# Patient Record
Sex: Female | Born: 1935 | Race: White | Hispanic: No | Smoking: Never smoker
Health system: Southern US, Community
[De-identification: ages and names within clinical notes are randomized; demographics above are authoritative.]

## PROBLEM LIST (undated history)

## (undated) DIAGNOSIS — M109 Gout, unspecified: Secondary | ICD-10-CM

## (undated) DIAGNOSIS — E559 Vitamin D deficiency, unspecified: Secondary | ICD-10-CM

## (undated) DIAGNOSIS — F32A Depression, unspecified: Secondary | ICD-10-CM

## (undated) DIAGNOSIS — E079 Disorder of thyroid, unspecified: Secondary | ICD-10-CM

## (undated) DIAGNOSIS — I700 Atherosclerosis of aorta: Secondary | ICD-10-CM

## (undated) DIAGNOSIS — E538 Deficiency of other specified B group vitamins: Secondary | ICD-10-CM

## (undated) DIAGNOSIS — K219 Gastro-esophageal reflux disease without esophagitis: Secondary | ICD-10-CM

## (undated) DIAGNOSIS — D509 Iron deficiency anemia, unspecified: Secondary | ICD-10-CM

## (undated) DIAGNOSIS — F0280 Dementia in other diseases classified elsewhere without behavioral disturbance: Secondary | ICD-10-CM

## (undated) DIAGNOSIS — N390 Urinary tract infection, site not specified: Secondary | ICD-10-CM

## (undated) DIAGNOSIS — N939 Abnormal uterine and vaginal bleeding, unspecified: Secondary | ICD-10-CM

## (undated) DIAGNOSIS — N200 Calculus of kidney: Secondary | ICD-10-CM

## (undated) DIAGNOSIS — I509 Heart failure, unspecified: Secondary | ICD-10-CM

## (undated) DIAGNOSIS — F329 Major depressive disorder, single episode, unspecified: Secondary | ICD-10-CM

## (undated) DIAGNOSIS — H6120 Impacted cerumen, unspecified ear: Secondary | ICD-10-CM

## (undated) DIAGNOSIS — G309 Alzheimer's disease, unspecified: Secondary | ICD-10-CM

## (undated) DIAGNOSIS — R1310 Dysphagia, unspecified: Secondary | ICD-10-CM

---

## 2016-02-06 ENCOUNTER — Encounter (HOSPITAL_COMMUNITY): Payer: Self-pay | Admitting: Emergency Medicine

## 2016-02-06 ENCOUNTER — Emergency Department (HOSPITAL_COMMUNITY): Payer: Medicare (Managed Care) | Admitting: Anesthesiology

## 2016-02-06 ENCOUNTER — Inpatient Hospital Stay (HOSPITAL_COMMUNITY)
Admission: EM | Admit: 2016-02-06 | Discharge: 2016-03-14 | DRG: 252 | Disposition: E | Payer: Medicare (Managed Care) | Attending: Internal Medicine | Admitting: Internal Medicine

## 2016-02-06 ENCOUNTER — Encounter (HOSPITAL_COMMUNITY): Admission: EM | Disposition: E | Payer: Self-pay | Source: Home / Self Care | Attending: Vascular Surgery

## 2016-02-06 ENCOUNTER — Emergency Department (HOSPITAL_COMMUNITY): Payer: Medicare (Managed Care)

## 2016-02-06 DIAGNOSIS — K219 Gastro-esophageal reflux disease without esophagitis: Secondary | ICD-10-CM | POA: Diagnosis present

## 2016-02-06 DIAGNOSIS — N183 Chronic kidney disease, stage 3 (moderate): Secondary | ICD-10-CM | POA: Diagnosis present

## 2016-02-06 DIAGNOSIS — Z6841 Body Mass Index (BMI) 40.0 and over, adult: Secondary | ICD-10-CM | POA: Diagnosis not present

## 2016-02-06 DIAGNOSIS — I272 Other secondary pulmonary hypertension: Secondary | ICD-10-CM | POA: Diagnosis present

## 2016-02-06 DIAGNOSIS — Z87442 Personal history of urinary calculi: Secondary | ICD-10-CM | POA: Diagnosis not present

## 2016-02-06 DIAGNOSIS — R7989 Other specified abnormal findings of blood chemistry: Secondary | ICD-10-CM | POA: Diagnosis not present

## 2016-02-06 DIAGNOSIS — F0280 Dementia in other diseases classified elsewhere without behavioral disturbance: Secondary | ICD-10-CM | POA: Diagnosis present

## 2016-02-06 DIAGNOSIS — Z66 Do not resuscitate: Secondary | ICD-10-CM | POA: Diagnosis not present

## 2016-02-06 DIAGNOSIS — M6282 Rhabdomyolysis: Secondary | ICD-10-CM | POA: Diagnosis not present

## 2016-02-06 DIAGNOSIS — I5041 Acute combined systolic (congestive) and diastolic (congestive) heart failure: Secondary | ICD-10-CM | POA: Diagnosis present

## 2016-02-06 DIAGNOSIS — R1310 Dysphagia, unspecified: Secondary | ICD-10-CM | POA: Diagnosis present

## 2016-02-06 DIAGNOSIS — I5021 Acute systolic (congestive) heart failure: Secondary | ICD-10-CM | POA: Diagnosis not present

## 2016-02-06 DIAGNOSIS — Z515 Encounter for palliative care: Secondary | ICD-10-CM | POA: Diagnosis not present

## 2016-02-06 DIAGNOSIS — N179 Acute kidney failure, unspecified: Secondary | ICD-10-CM

## 2016-02-06 DIAGNOSIS — I998 Other disorder of circulatory system: Secondary | ICD-10-CM | POA: Diagnosis not present

## 2016-02-06 DIAGNOSIS — I462 Cardiac arrest due to underlying cardiac condition: Secondary | ICD-10-CM | POA: Diagnosis not present

## 2016-02-06 DIAGNOSIS — Z885 Allergy status to narcotic agent status: Secondary | ICD-10-CM

## 2016-02-06 DIAGNOSIS — M79A21 Nontraumatic compartment syndrome of right lower extremity: Secondary | ICD-10-CM | POA: Diagnosis present

## 2016-02-06 DIAGNOSIS — I130 Hypertensive heart and chronic kidney disease with heart failure and stage 1 through stage 4 chronic kidney disease, or unspecified chronic kidney disease: Secondary | ICD-10-CM | POA: Diagnosis present

## 2016-02-06 DIAGNOSIS — I4891 Unspecified atrial fibrillation: Secondary | ICD-10-CM | POA: Diagnosis not present

## 2016-02-06 DIAGNOSIS — D62 Acute posthemorrhagic anemia: Secondary | ICD-10-CM | POA: Diagnosis not present

## 2016-02-06 DIAGNOSIS — N200 Calculus of kidney: Secondary | ICD-10-CM | POA: Diagnosis present

## 2016-02-06 DIAGNOSIS — D509 Iron deficiency anemia, unspecified: Secondary | ICD-10-CM | POA: Diagnosis present

## 2016-02-06 DIAGNOSIS — R34 Anuria and oliguria: Secondary | ICD-10-CM | POA: Diagnosis not present

## 2016-02-06 DIAGNOSIS — Z789 Other specified health status: Secondary | ICD-10-CM

## 2016-02-06 DIAGNOSIS — I959 Hypotension, unspecified: Secondary | ICD-10-CM | POA: Diagnosis not present

## 2016-02-06 DIAGNOSIS — E874 Mixed disorder of acid-base balance: Secondary | ICD-10-CM | POA: Diagnosis not present

## 2016-02-06 DIAGNOSIS — I471 Supraventricular tachycardia: Secondary | ICD-10-CM | POA: Diagnosis not present

## 2016-02-06 DIAGNOSIS — M109 Gout, unspecified: Secondary | ICD-10-CM | POA: Diagnosis present

## 2016-02-06 DIAGNOSIS — I739 Peripheral vascular disease, unspecified: Secondary | ICD-10-CM | POA: Diagnosis present

## 2016-02-06 DIAGNOSIS — R627 Adult failure to thrive: Secondary | ICD-10-CM | POA: Diagnosis present

## 2016-02-06 DIAGNOSIS — I4901 Ventricular fibrillation: Secondary | ICD-10-CM | POA: Diagnosis not present

## 2016-02-06 DIAGNOSIS — N139 Obstructive and reflux uropathy, unspecified: Secondary | ICD-10-CM | POA: Diagnosis present

## 2016-02-06 DIAGNOSIS — I469 Cardiac arrest, cause unspecified: Secondary | ICD-10-CM

## 2016-02-06 DIAGNOSIS — I509 Heart failure, unspecified: Secondary | ICD-10-CM

## 2016-02-06 DIAGNOSIS — R778 Other specified abnormalities of plasma proteins: Secondary | ICD-10-CM | POA: Diagnosis not present

## 2016-02-06 DIAGNOSIS — N170 Acute kidney failure with tubular necrosis: Secondary | ICD-10-CM | POA: Diagnosis present

## 2016-02-06 DIAGNOSIS — I743 Embolism and thrombosis of arteries of the lower extremities: Secondary | ICD-10-CM | POA: Diagnosis present

## 2016-02-06 DIAGNOSIS — E876 Hypokalemia: Secondary | ICD-10-CM | POA: Diagnosis not present

## 2016-02-06 DIAGNOSIS — I4581 Long QT syndrome: Secondary | ICD-10-CM | POA: Diagnosis not present

## 2016-02-06 DIAGNOSIS — J90 Pleural effusion, not elsewhere classified: Secondary | ICD-10-CM

## 2016-02-06 DIAGNOSIS — B3749 Other urogenital candidiasis: Secondary | ICD-10-CM | POA: Diagnosis present

## 2016-02-06 DIAGNOSIS — I700 Atherosclerosis of aorta: Secondary | ICD-10-CM | POA: Diagnosis present

## 2016-02-06 DIAGNOSIS — I480 Paroxysmal atrial fibrillation: Secondary | ICD-10-CM | POA: Diagnosis present

## 2016-02-06 DIAGNOSIS — A419 Sepsis, unspecified organism: Secondary | ICD-10-CM

## 2016-02-06 DIAGNOSIS — J9601 Acute respiratory failure with hypoxia: Secondary | ICD-10-CM | POA: Diagnosis not present

## 2016-02-06 DIAGNOSIS — I701 Atherosclerosis of renal artery: Secondary | ICD-10-CM | POA: Diagnosis present

## 2016-02-06 DIAGNOSIS — F329 Major depressive disorder, single episode, unspecified: Secondary | ICD-10-CM | POA: Diagnosis present

## 2016-02-06 DIAGNOSIS — I472 Ventricular tachycardia: Secondary | ICD-10-CM | POA: Diagnosis not present

## 2016-02-06 DIAGNOSIS — T462X5A Adverse effect of other antidysrhythmic drugs, initial encounter: Secondary | ICD-10-CM | POA: Diagnosis not present

## 2016-02-06 DIAGNOSIS — I70229 Atherosclerosis of native arteries of extremities with rest pain, unspecified extremity: Secondary | ICD-10-CM

## 2016-02-06 DIAGNOSIS — E039 Hypothyroidism, unspecified: Secondary | ICD-10-CM | POA: Diagnosis present

## 2016-02-06 DIAGNOSIS — I7090 Unspecified atherosclerosis: Secondary | ICD-10-CM

## 2016-02-06 DIAGNOSIS — G309 Alzheimer's disease, unspecified: Secondary | ICD-10-CM | POA: Diagnosis present

## 2016-02-06 DIAGNOSIS — Z87440 Personal history of urinary (tract) infections: Secondary | ICD-10-CM | POA: Diagnosis not present

## 2016-02-06 DIAGNOSIS — M79604 Pain in right leg: Secondary | ICD-10-CM | POA: Diagnosis present

## 2016-02-06 DIAGNOSIS — E669 Obesity, unspecified: Secondary | ICD-10-CM | POA: Diagnosis present

## 2016-02-06 DIAGNOSIS — J9811 Atelectasis: Secondary | ICD-10-CM | POA: Diagnosis present

## 2016-02-06 DIAGNOSIS — E538 Deficiency of other specified B group vitamins: Secondary | ICD-10-CM | POA: Diagnosis present

## 2016-02-06 HISTORY — DX: Iron deficiency anemia, unspecified: D50.9

## 2016-02-06 HISTORY — DX: Disorder of thyroid, unspecified: E07.9

## 2016-02-06 HISTORY — PX: EMBOLECTOMY: SHX44

## 2016-02-06 HISTORY — DX: Calculus of kidney: N20.0

## 2016-02-06 HISTORY — DX: Gastro-esophageal reflux disease without esophagitis: K21.9

## 2016-02-06 HISTORY — DX: Gout, unspecified: M10.9

## 2016-02-06 HISTORY — PX: APPLICATION OF WOUND VAC: SHX5189

## 2016-02-06 HISTORY — DX: Impacted cerumen, unspecified ear: H61.20

## 2016-02-06 HISTORY — DX: Vitamin D deficiency, unspecified: E55.9

## 2016-02-06 HISTORY — DX: Alzheimer's disease, unspecified: G30.9

## 2016-02-06 HISTORY — PX: FASCIOTOMY: SHX132

## 2016-02-06 HISTORY — DX: Dysphagia, unspecified: R13.10

## 2016-02-06 HISTORY — PX: PATCH ANGIOPLASTY: SHX6230

## 2016-02-06 HISTORY — DX: Depression, unspecified: F32.A

## 2016-02-06 HISTORY — DX: Deficiency of other specified B group vitamins: E53.8

## 2016-02-06 HISTORY — DX: Heart failure, unspecified: I50.9

## 2016-02-06 HISTORY — DX: Urinary tract infection, site not specified: N39.0

## 2016-02-06 HISTORY — DX: Major depressive disorder, single episode, unspecified: F32.9

## 2016-02-06 HISTORY — DX: Atherosclerosis of aorta: I70.0

## 2016-02-06 HISTORY — DX: Abnormal uterine and vaginal bleeding, unspecified: N93.9

## 2016-02-06 HISTORY — DX: Dementia in other diseases classified elsewhere, unspecified severity, without behavioral disturbance, psychotic disturbance, mood disturbance, and anxiety: F02.80

## 2016-02-06 LAB — URINALYSIS, ROUTINE W REFLEX MICROSCOPIC
GLUCOSE, UA: 100 mg/dL — AB
Ketones, ur: 40 mg/dL — AB
Nitrite: POSITIVE — AB
Protein, ur: 100 mg/dL — AB
SPECIFIC GRAVITY, URINE: 1.02 (ref 1.005–1.030)
pH: 5 (ref 5.0–8.0)

## 2016-02-06 LAB — URINE MICROSCOPIC-ADD ON

## 2016-02-06 LAB — TROPONIN I: Troponin I: 0.42 ng/mL — ABNORMAL HIGH (ref <0.031)

## 2016-02-06 LAB — CBC
HCT: 37.7 % (ref 36.0–46.0)
Hemoglobin: 12.4 g/dL (ref 12.0–15.0)
MCH: 30.5 pg (ref 26.0–34.0)
MCHC: 32.9 g/dL (ref 30.0–36.0)
MCV: 92.9 fL (ref 78.0–100.0)
PLATELETS: 188 10*3/uL (ref 150–400)
RBC: 4.06 MIL/uL (ref 3.87–5.11)
RDW: 13.6 % (ref 11.5–15.5)
WBC: 8 10*3/uL (ref 4.0–10.5)

## 2016-02-06 LAB — BASIC METABOLIC PANEL
ANION GAP: 18 — AB (ref 5–15)
BUN: 11 mg/dL (ref 6–20)
CALCIUM: 8.7 mg/dL — AB (ref 8.9–10.3)
CO2: 16 mmol/L — AB (ref 22–32)
Chloride: 105 mmol/L (ref 101–111)
Creatinine, Ser: 2.28 mg/dL — ABNORMAL HIGH (ref 0.44–1.00)
GFR calc Af Amer: 22 mL/min — ABNORMAL LOW (ref >60)
GFR calc non Af Amer: 19 mL/min — ABNORMAL LOW (ref >60)
GLUCOSE: 183 mg/dL — AB (ref 65–99)
Potassium: 3.6 mmol/L (ref 3.5–5.1)
Sodium: 139 mmol/L (ref 135–145)

## 2016-02-06 LAB — PROTIME-INR
INR: 1.19 (ref 0.00–1.49)
PROTHROMBIN TIME: 15.3 s — AB (ref 11.6–15.2)

## 2016-02-06 LAB — GLUCOSE, CAPILLARY: Glucose-Capillary: 194 mg/dL — ABNORMAL HIGH (ref 65–99)

## 2016-02-06 LAB — MAGNESIUM: MAGNESIUM: 1.7 mg/dL (ref 1.7–2.4)

## 2016-02-06 LAB — MRSA PCR SCREENING: MRSA by PCR: POSITIVE — AB

## 2016-02-06 LAB — BRAIN NATRIURETIC PEPTIDE: B Natriuretic Peptide: 424.3 pg/mL — ABNORMAL HIGH (ref 0.0–100.0)

## 2016-02-06 LAB — TSH: TSH: 0.377 u[IU]/mL (ref 0.350–4.500)

## 2016-02-06 SURGERY — EMBOLECTOMY
Anesthesia: General | Site: Leg Upper | Laterality: Right

## 2016-02-06 MED ORDER — ALBUMIN HUMAN 5 % IV SOLN
INTRAVENOUS | Status: AC
  Administered 2016-02-06: 12.5 g via INTRAVENOUS
  Filled 2016-02-06: qty 250

## 2016-02-06 MED ORDER — LIDOCAINE HCL (CARDIAC) 20 MG/ML IV SOLN
INTRAVENOUS | Status: DC | PRN
  Administered 2016-02-06: 50 mg via INTRAVENOUS

## 2016-02-06 MED ORDER — LABETALOL HCL 5 MG/ML IV SOLN: 10.0000 mg | INTRAVENOUS | Status: DC | PRN

## 2016-02-06 MED ORDER — HEPARIN (PORCINE) IN NACL 100-0.45 UNIT/ML-% IJ SOLN
950.0000 [IU]/h | INTRAMUSCULAR | Status: DC
Start: 2016-02-06 — End: 2016-02-07
  Administered 2016-02-06: 1200 [IU]/h via INTRAVENOUS
  Administered 2016-02-07: 950 [IU]/h via INTRAVENOUS
  Filled 2016-02-06 (×3): qty 250

## 2016-02-06 MED ORDER — SULFAMETHOXAZOLE-TRIMETHOPRIM 800-160 MG PO TABS
2.0000 | ORAL_TABLET | Freq: Two times a day (BID) | ORAL | Status: DC
  Administered 2016-02-06: 2 via ORAL
  Filled 2016-02-06 (×2): qty 2

## 2016-02-06 MED ORDER — LEVOTHYROXINE SODIUM 75 MCG PO TABS
75.0000 ug | ORAL_TABLET | Freq: Every day | ORAL | Status: DC
  Administered 2016-02-07 – 2016-02-14 (×6): 75 ug via ORAL
  Filled 2016-02-06 (×9): qty 1

## 2016-02-06 MED ORDER — 0.9 % SODIUM CHLORIDE (POUR BTL) OPTIME
TOPICAL | Status: DC | PRN
  Administered 2016-02-06: 2000 mL

## 2016-02-06 MED ORDER — DILTIAZEM LOAD VIA INFUSION: 10.0000 mg | Freq: Once | INTRAVENOUS | Status: DC

## 2016-02-06 MED ORDER — SODIUM CHLORIDE 0.9 % IV SOLN
INTRAVENOUS | Status: DC | PRN
  Administered 2016-02-06: 500 mL

## 2016-02-06 MED ORDER — HEPARIN SODIUM (PORCINE) 1000 UNIT/ML IJ SOLN
INTRAMUSCULAR | Status: DC | PRN
  Administered 2016-02-06: 7 mL via INTRAVENOUS

## 2016-02-06 MED ORDER — ACETAMINOPHEN 325 MG PO TABS: 325.0000 mg | ORAL_TABLET | ORAL | Status: DC | PRN

## 2016-02-06 MED ORDER — MORPHINE SULFATE (PF) 2 MG/ML IV SOLN
1.0000 mg | INTRAVENOUS | Status: DC | PRN
  Administered 2016-02-07 – 2016-02-13 (×2): 2 mg via INTRAVENOUS
  Administered 2016-02-14 (×2): 1 mg via INTRAVENOUS
  Administered 2016-02-15 (×2): 2 mg via INTRAVENOUS
  Filled 2016-02-06 (×7): qty 1

## 2016-02-06 MED ORDER — METOPROLOL TARTRATE 5 MG/5ML IV SOLN: 2.0000 mg | INTRAVENOUS | Status: DC | PRN

## 2016-02-06 MED ORDER — OXYCODONE-ACETAMINOPHEN 5-325 MG PO TABS
1.0000 | ORAL_TABLET | ORAL | Status: DC | PRN
  Administered 2016-02-06: 1 via ORAL
  Administered 2016-02-07: 2 via ORAL
  Administered 2016-02-12: 1 via ORAL
  Filled 2016-02-06: qty 1
  Filled 2016-02-06: qty 2
  Filled 2016-02-06: qty 1

## 2016-02-06 MED ORDER — SODIUM CHLORIDE 0.9 % IV SOLN
INTRAVENOUS | Status: DC
  Administered 2016-02-06 (×2): via INTRAVENOUS

## 2016-02-06 MED ORDER — FENTANYL CITRATE (PF) 250 MCG/5ML IJ SOLN
INTRAMUSCULAR | Status: AC
  Filled 2016-02-06: qty 5

## 2016-02-06 MED ORDER — DONEPEZIL HCL 10 MG PO TABS
10.0000 mg | ORAL_TABLET | Freq: Every day | ORAL | Status: DC
  Administered 2016-02-06 – 2016-02-14 (×7): 10 mg via ORAL
  Filled 2016-02-06 (×7): qty 1

## 2016-02-06 MED ORDER — HALOPERIDOL 0.5 MG PO TABS
0.5000 mg | ORAL_TABLET | Freq: Two times a day (BID) | ORAL | Status: DC | PRN
  Filled 2016-02-06: qty 1

## 2016-02-06 MED ORDER — THROMBIN 20000 UNITS EX SOLR
CUTANEOUS | Status: AC
  Filled 2016-02-06: qty 20000

## 2016-02-06 MED ORDER — POTASSIUM CHLORIDE CRYS ER 20 MEQ PO TBCR: 20.0000 meq | EXTENDED_RELEASE_TABLET | Freq: Every day | ORAL | Status: DC | PRN

## 2016-02-06 MED ORDER — ALUM & MAG HYDROXIDE-SIMETH 200-200-20 MG/5ML PO SUSP: 15.0000 mL | ORAL | Status: DC | PRN

## 2016-02-06 MED ORDER — DILTIAZEM HCL 100 MG IV SOLR
5.0000 mg/h | INTRAVENOUS | Status: DC
  Administered 2016-02-06: 5 mg/h via INTRAVENOUS
  Filled 2016-02-06: qty 100

## 2016-02-06 MED ORDER — MELATONIN 5 MG PO TABS: 5.0000 mg | ORAL_TABLET | Freq: Every day | ORAL | Status: DC

## 2016-02-06 MED ORDER — SODIUM CHLORIDE 0.9 % IV SOLN
INTRAVENOUS | Status: DC
  Administered 2016-02-06: 22:00:00 via INTRAVENOUS

## 2016-02-06 MED ORDER — DEXTROSE 5 % IV SOLN
100.0000 mg | INTRAVENOUS | Status: DC | PRN
  Administered 2016-02-06: 10 mg/h via INTRAVENOUS

## 2016-02-06 MED ORDER — PHENOL 1.4 % MT LIQD: 1.0000 | OROMUCOSAL | Status: DC | PRN

## 2016-02-06 MED ORDER — ONDANSETRON HCL 4 MG/2ML IJ SOLN
4.0000 mg | Freq: Four times a day (QID) | INTRAMUSCULAR | Status: DC | PRN
  Administered 2016-02-08 – 2016-02-09 (×2): 4 mg via INTRAVENOUS
  Filled 2016-02-06 (×2): qty 2

## 2016-02-06 MED ORDER — PANTOPRAZOLE SODIUM 40 MG PO TBEC
40.0000 mg | DELAYED_RELEASE_TABLET | Freq: Every day | ORAL | Status: DC
  Administered 2016-02-06 – 2016-02-15 (×7): 40 mg via ORAL
  Filled 2016-02-06 (×9): qty 1

## 2016-02-06 MED ORDER — SODIUM CHLORIDE 0.9 % IV SOLN
500.0000 mL | Freq: Once | INTRAVENOUS | Status: AC | PRN
  Administered 2016-02-06: 500 mL via INTRAVENOUS

## 2016-02-06 MED ORDER — SUGAMMADEX SODIUM 200 MG/2ML IV SOLN
INTRAVENOUS | Status: DC | PRN
  Administered 2016-02-06: 166 mg via INTRAVENOUS

## 2016-02-06 MED ORDER — CYANOCOBALAMIN 1000 MCG/ML IJ SOLN
2000.0000 ug | INTRAMUSCULAR | Status: DC
  Administered 2016-02-12: 2000 ug via INTRAMUSCULAR
  Filled 2016-02-06: qty 2

## 2016-02-06 MED ORDER — ALBUMIN HUMAN 5 % IV SOLN
INTRAVENOUS | Status: DC | PRN
  Administered 2016-02-06: 17:00:00 via INTRAVENOUS

## 2016-02-06 MED ORDER — GUAIFENESIN-DM 100-10 MG/5ML PO SYRP: 15.0000 mL | ORAL_SOLUTION | ORAL | Status: DC | PRN

## 2016-02-06 MED ORDER — CEFUROXIME SODIUM 1.5 G IJ SOLR
1.5000 g | Freq: Two times a day (BID) | INTRAMUSCULAR | Status: AC
  Administered 2016-02-06 – 2016-02-07 (×2): 1.5 g via INTRAVENOUS
  Filled 2016-02-06 (×2): qty 1.5

## 2016-02-06 MED ORDER — PHENYLEPHRINE HCL 10 MG/ML IJ SOLN
10.0000 mg | INTRAVENOUS | Status: DC | PRN
  Administered 2016-02-06: 50 ug/min via INTRAVENOUS

## 2016-02-06 MED ORDER — MUPIROCIN 2 % EX OINT
1.0000 "application " | TOPICAL_OINTMENT | Freq: Two times a day (BID) | CUTANEOUS | Status: AC
  Administered 2016-02-06 – 2016-02-11 (×10): 1 via NASAL
  Filled 2016-02-06 (×2): qty 22

## 2016-02-06 MED ORDER — FENTANYL CITRATE (PF) 100 MCG/2ML IJ SOLN
INTRAMUSCULAR | Status: DC | PRN
  Administered 2016-02-06 (×2): 50 ug via INTRAVENOUS

## 2016-02-06 MED ORDER — MAGNESIUM SULFATE 2 GM/50ML IV SOLN
2.0000 g | Freq: Every day | INTRAVENOUS | Status: AC | PRN
  Administered 2016-02-07: 2 g via INTRAVENOUS
  Filled 2016-02-06: qty 50

## 2016-02-06 MED ORDER — HYDRALAZINE HCL 20 MG/ML IJ SOLN: 5.0000 mg | INTRAMUSCULAR | Status: DC | PRN

## 2016-02-06 MED ORDER — ALBUMIN HUMAN 5 % IV SOLN
12.5000 g | Freq: Once | INTRAVENOUS | Status: AC
  Administered 2016-02-06: 12.5 g via INTRAVENOUS

## 2016-02-06 MED ORDER — FERROUS SULFATE 325 (65 FE) MG PO TABS
325.0000 mg | ORAL_TABLET | Freq: Every day | ORAL | Status: DC
  Administered 2016-02-07 – 2016-02-12 (×5): 325 mg via ORAL
  Filled 2016-02-06 (×10): qty 1

## 2016-02-06 MED ORDER — COLCHICINE 0.6 MG PO TABS
0.6000 mg | ORAL_TABLET | Freq: Every day | ORAL | Status: DC
  Administered 2016-02-07: 0.6 mg via ORAL
  Filled 2016-02-06: qty 1

## 2016-02-06 MED ORDER — FUROSEMIDE 10 MG/ML IJ SOLN
20.0000 mg | Freq: Once | INTRAMUSCULAR | Status: AC
  Administered 2016-02-06: 20 mg via INTRAVENOUS
  Filled 2016-02-06: qty 2

## 2016-02-06 MED ORDER — HEPARIN BOLUS VIA INFUSION
4000.0000 [IU] | Freq: Once | INTRAVENOUS | Status: AC
  Administered 2016-02-06: 4000 [IU] via INTRAVENOUS
  Filled 2016-02-06: qty 4000

## 2016-02-06 MED ORDER — DOCUSATE SODIUM 100 MG PO CAPS
100.0000 mg | ORAL_CAPSULE | Freq: Every day | ORAL | Status: DC
  Administered 2016-02-07 – 2016-02-11 (×3): 100 mg via ORAL
  Filled 2016-02-06 (×5): qty 1

## 2016-02-06 MED ORDER — ACETAMINOPHEN 325 MG RE SUPP: 325.0000 mg | RECTAL | Status: DC | PRN

## 2016-02-06 MED ORDER — PROPOFOL 10 MG/ML IV BOLUS
INTRAVENOUS | Status: AC
  Filled 2016-02-06: qty 20

## 2016-02-06 MED ORDER — HEPARIN (PORCINE) IN NACL 100-0.45 UNIT/ML-% IJ SOLN
1200.0000 [IU]/h | INTRAMUSCULAR | Status: DC
  Administered 2016-02-06: 1200 [IU]/h via INTRAVENOUS
  Filled 2016-02-06: qty 250

## 2016-02-06 MED ORDER — SUCCINYLCHOLINE CHLORIDE 20 MG/ML IJ SOLN
INTRAMUSCULAR | Status: DC | PRN
  Administered 2016-02-06: 100 mg via INTRAVENOUS

## 2016-02-06 MED ORDER — METOPROLOL SUCCINATE ER 25 MG PO TB24: 12.5000 mg | ORAL_TABLET | Freq: Every day | ORAL | Status: DC

## 2016-02-06 MED ORDER — SODIUM CHLORIDE 0.45 % IV SOLN
INTRAVENOUS | Status: DC
  Administered 2016-02-07 (×2): via INTRAVENOUS
  Filled 2016-02-06 (×5): qty 50

## 2016-02-06 MED ORDER — PROPOFOL 10 MG/ML IV BOLUS
INTRAVENOUS | Status: DC | PRN
  Administered 2016-02-06: 100 mg via INTRAVENOUS

## 2016-02-06 MED ORDER — CHLORHEXIDINE GLUCONATE CLOTH 2 % EX PADS
6.0000 | MEDICATED_PAD | Freq: Every day | CUTANEOUS | Status: AC
  Administered 2016-02-07 – 2016-02-11 (×5): 6 via TOPICAL

## 2016-02-06 MED ORDER — VECURONIUM BROMIDE 10 MG IV SOLR
INTRAVENOUS | Status: DC | PRN
Start: 2016-02-06 — End: 2016-02-06
  Administered 2016-02-06: 2 mg via INTRAVENOUS

## 2016-02-06 MED ORDER — ONDANSETRON HCL 4 MG/2ML IJ SOLN
INTRAMUSCULAR | Status: DC | PRN
  Administered 2016-02-06: 4 mg via INTRAVENOUS

## 2016-02-06 MED ORDER — CEFAZOLIN SODIUM-DEXTROSE 2-3 GM-% IV SOLR
INTRAVENOUS | Status: DC | PRN
  Administered 2016-02-06: 2 g via INTRAVENOUS

## 2016-02-06 MED ORDER — CEFAZOLIN SODIUM 1-5 GM-% IV SOLN
1.0000 g | INTRAVENOUS | Status: AC
  Administered 2016-02-06: 1 g via INTRAVENOUS
  Filled 2016-02-06: qty 50

## 2016-02-06 MED ORDER — CITALOPRAM HYDROBROMIDE 20 MG PO TABS
20.0000 mg | ORAL_TABLET | Freq: Every day | ORAL | Status: DC
  Administered 2016-02-06 – 2016-02-08 (×3): 20 mg via ORAL
  Filled 2016-02-06 (×4): qty 1

## 2016-02-06 SURGICAL SUPPLY — 57 items
BANDAGE ELASTIC 4 VELCRO ST LF (GAUZE/BANDAGES/DRESSINGS) ×8 IMPLANT
BANDAGE ESMARK 6X9 LF (GAUZE/BANDAGES/DRESSINGS) IMPLANT
BNDG ESMARK 6X9 LF (GAUZE/BANDAGES/DRESSINGS)
BNDG GAUZE ELAST 4 BULKY (GAUZE/BANDAGES/DRESSINGS) ×8 IMPLANT
CANISTER SUCTION 2500CC (MISCELLANEOUS) ×4 IMPLANT
CANISTER WOUND CARE 500ML ATS (WOUND CARE) ×4 IMPLANT
CATH EMB 3FR 80CM (CATHETERS) ×4 IMPLANT
CATH EMB 4FR 80CM (CATHETERS) ×4 IMPLANT
CATH EMB 5FR 80CM (CATHETERS) IMPLANT
CLIP TI MEDIUM 24 (CLIP) ×4 IMPLANT
CLIP TI WIDE RED SMALL 24 (CLIP) ×4 IMPLANT
CUFF TOURNIQUET SINGLE 18IN (TOURNIQUET CUFF) IMPLANT
CUFF TOURNIQUET SINGLE 24IN (TOURNIQUET CUFF) IMPLANT
CUFF TOURNIQUET SINGLE 34IN LL (TOURNIQUET CUFF) IMPLANT
CUFF TOURNIQUET SINGLE 44IN (TOURNIQUET CUFF) IMPLANT
DRAIN CHANNEL 15F RND FF W/TCR (WOUND CARE) IMPLANT
DRAIN WOUND SNY 15 RND (WOUND CARE) ×4 IMPLANT
DRAPE X-RAY CASS 24X20 (DRAPES) IMPLANT
DRSG VAC ATS MED SENSATRAC (GAUZE/BANDAGES/DRESSINGS) ×4 IMPLANT
ELECT REM PT RETURN 9FT ADLT (ELECTROSURGICAL) ×4
ELECTRODE REM PT RTRN 9FT ADLT (ELECTROSURGICAL) ×2 IMPLANT
EVACUATOR SILICONE 100CC (DRAIN) ×4 IMPLANT
GLOVE BIO SURGEON STRL SZ 6.5 (GLOVE) ×3 IMPLANT
GLOVE BIO SURGEON STRL SZ7.5 (GLOVE) ×4 IMPLANT
GLOVE BIO SURGEONS STRL SZ 6.5 (GLOVE) ×1
GLOVE BIOGEL PI IND STRL 6.5 (GLOVE) ×2 IMPLANT
GLOVE BIOGEL PI IND STRL 8 (GLOVE) ×2 IMPLANT
GLOVE BIOGEL PI INDICATOR 6.5 (GLOVE) ×2
GLOVE BIOGEL PI INDICATOR 8 (GLOVE) ×2
GOWN STRL REUS W/ TWL LRG LVL3 (GOWN DISPOSABLE) ×4 IMPLANT
GOWN STRL REUS W/TWL LRG LVL3 (GOWN DISPOSABLE) ×4
GOWN STRL REUS W/TWL XL LVL3 (GOWN DISPOSABLE) ×12 IMPLANT
KIT BASIN OR (CUSTOM PROCEDURE TRAY) ×4 IMPLANT
KIT ROOM TURNOVER OR (KITS) ×4 IMPLANT
LIQUID BAND (GAUZE/BANDAGES/DRESSINGS) ×4 IMPLANT
NS IRRIG 1000ML POUR BTL (IV SOLUTION) ×8 IMPLANT
PACK PERIPHERAL VASCULAR (CUSTOM PROCEDURE TRAY) ×4 IMPLANT
PAD ARMBOARD 7.5X6 YLW CONV (MISCELLANEOUS) ×8 IMPLANT
SET COLLECT BLD 21X3/4 12 (NEEDLE) IMPLANT
SPONGE GAUZE 4X4 12PLY STER LF (GAUZE/BANDAGES/DRESSINGS) ×4 IMPLANT
SPONGE SURGIFOAM ABS GEL 100 (HEMOSTASIS) IMPLANT
STAPLER VISISTAT (STAPLE) ×4 IMPLANT
STOPCOCK 4 WAY LG BORE MALE ST (IV SETS) IMPLANT
SUT ETHILON 3 0 PS 1 (SUTURE) ×4 IMPLANT
SUT PROLENE 5 0 C 1 24 (SUTURE) IMPLANT
SUT PROLENE 6 0 BV (SUTURE) ×4 IMPLANT
SUT VIC AB 2-0 CTB1 (SUTURE) ×8 IMPLANT
SUT VIC AB 3-0 SH 27 (SUTURE) ×2
SUT VIC AB 3-0 SH 27X BRD (SUTURE) ×2 IMPLANT
SUT VICRYL 4-0 PS2 18IN ABS (SUTURE) ×4 IMPLANT
SYR 3ML LL SCALE MARK (SYRINGE) ×8 IMPLANT
SYR TB 1ML LUER SLIP (SYRINGE) ×4 IMPLANT
TAPE CLOTH SURG 4X10 WHT LF (GAUZE/BANDAGES/DRESSINGS) ×4 IMPLANT
TRAY FOLEY W/METER SILVER 16FR (SET/KITS/TRAYS/PACK) ×4 IMPLANT
TUBING EXTENTION W/L.L. (IV SETS) IMPLANT
UNDERPAD 30X30 INCONTINENT (UNDERPADS AND DIAPERS) ×4 IMPLANT
WATER STERILE IRR 1000ML POUR (IV SOLUTION) ×4 IMPLANT

## 2016-02-06 NOTE — ED Notes (Signed)
Pt O2 saturation noted to be 82% on room air. Attempted to place patient on 2L nasal cannula however pt will not keep the O2 on due to dementia. O2 now noted to be 85%.

## 2016-02-06 NOTE — Consult Note (Signed)
VASCULAR SURGERY ASSESSMENT & PLAN:   I have interviewed the patient and examined the patient. I agree with the findings by the PA.   Ischemic right lower extremity most likely secondary to new onset A. Fib. Plan emergent right femoral embolectomy. I am unable to palpate a right femoral pulse. She does have a left femoral pulse. I have explained that if I cannot get infow, I could potentially need to do a left to right fem fem bypass. I also explained to the family that she could potentially need a fasciotomy. They understand that without revascularization this is clearly a limb threatening problem. Risks of surgery were discusses including the risk of infection, wound healing problems and limb loss.    crt = 2.28, GFR = 19 so will try to avoid contrast.  Cari Carawayhris Dickson, MD [phone]  [facility]  Patient name: Valerie Cordova MRN: [id number] DOB: August 30, 1932 Sex: female  REASON FOR CONSULT: ischemic right leg  HPI: Valerie Cordova is a 82 y.o. female, who presents for evaluation of right lower extremity ischemia. The patient has dementia and the history is provided by the patient's daughter who is at the bedside. The patient lives with her daughter, but goes to a SNF during the day. The daughter reports visiting her mother around 7:30 am today and she was ambulating well without assistance. The patient had approximately 4 oz of coke with her meds. She did not eat breakfast. Around 9-9:30 am, the nursing assistant had difficulty getting the patient up from the chair secondary to right foot pain. The patient has never had previous pain in her legs. She [facility]. She has never had atrial fibrillation in the past. She is not on anticoagulation. The patient is usually talkative per the daughter but is not talkative now secondary to pain.   She denies chest pain or shortness of breath. She has no prior history of CAD or CVA. She does not have  diabetes. She has a history of CHF. The daughter reports a one year history of vaginal bleeding. The daughter is frustrated that her mother's physicians are not working up this issue. She has history of gout. She currently has a UTI.   Past Medical History  Diagnosis Date  . Vitamin D deficiency   . Vitamin B12 deficiency   . Abnormal uterine and vaginal bleeding, unspecified   . Gout   . Atherosclerosis of abdominal aorta (HCC)   . UTI (lower urinary tract infection)   . CHF (congestive heart failure) (HCC)   . Ceruminosis   . Dysphagia   . Iron deficiency anemia   . Alzheimer disease   . Depression   . Nephrolithiasis   . GERD (gastroesophageal reflux disease)   . Thyroid disease     hypo    No family history on file.  No FHx of premature cardiovascular disease.   SOCIAL HISTORY: Social History   Social History  . Marital Status: Unknown    Spouse Name: N/A  . Number of Children: N/A  . Years of Education: N/A   Occupational History  . Not on file.   Social History Main Topics  . Smoking status: Not on file  . Smokeless tobacco: Not on file  . Alcohol Use: Not on file  . Drug Use: Not on file  . Sexual Activity: Not on file   Other Topics Concern  . Not on file   Social History Narrative  . No narrative on  file    Allergies  Allergen Reactions  . Codeine   . Vicodin [Hydrocodone-Acetaminophen]     Current Facility-Administered Medications  Medication Dose Route Frequency Provider Last Rate Last Dose  . ceFAZolin (ANCEF) IVPB 1 g/50 mL premix  1 g Intravenous On Call Raymond Gurney, PA-C      . diltiazem (CARDIZEM) 100 mg in dextrose 5 % 100 mL (1 mg/mL) infusion  5-15 mg/hr Intravenous Continuous Gerhard Munch, MD 5 mL/hr at 01/15/2016 1329 5 mg/hr at 01/16/2016 1329   Current Outpatient Prescriptions  Medication Sig Dispense Refill  . acetaminophen (TYLENOL) 325 MG tablet Take 650 mg by mouth every 6 (six) hours as needed for mild pain.    .  citalopram (CELEXA) 20 MG tablet Take 20 mg by mouth daily.    . colchicine 0.6 MG tablet Take 0.6 mg by mouth daily.    . cyanocobalamin (,VITAMIN B-12,) 1000 MCG/ML injection Inject 2,000 mcg into the muscle once.    . donepezil (ARICEPT) 10 MG tablet Take 10 mg by mouth at bedtime.    . ferrous sulfate 325 (65 FE) MG tablet Take 325 mg by mouth daily with breakfast.    . furosemide (LASIX) 20 MG tablet Take 30 mg by mouth See admin instructions. Monday-Friday    . haloperidol (HALDOL) 0.5 MG tablet Take 0.5 mg by mouth 2 (two) times daily as needed for agitation.    Marland Kitchen levothyroxine (SYNTHROID, LEVOTHROID) 75 MCG tablet Take 75 mcg by mouth daily before breakfast.    . Melatonin 5 MG TABS Take 5 mg by mouth at bedtime.    . metoprolol succinate (TOPROL-XL) 25 MG 24 hr tablet Take 12.5 mg by mouth daily.    . pantoprazole (PROTONIX) 40 MG tablet Take 40 mg by mouth daily.      REVIEW OF SYSTEMS:   denotes positive finding,  denotes negative finding Cardiac  Comments:  Chest pain or chest pressure:    Shortness of breath upon exertion:    Short of breath when lying flat:    Irregular heart rhythm: X       Vascular    Pain in calf, thigh, or hip brought on by ambulation:    Pain in feet at night that wakes you up from your sleep:     Blood clot in your veins:    Leg swelling:         Pulmonary    Oxygen at home:    Productive cough:     Wheezing:         Neurologic    Sudden weakness in arms or legs:  X Right leg  Sudden numbness in arms or legs:     Sudden onset of difficulty speaking or slurred speech:    Temporary loss of vision in one eye:     Problems with dizziness:         Gastrointestinal    Blood in stool:     Vomited blood:         Genitourinary    Burning when urinating:     Blood in urine:        Psychiatric    Major depression:         Hematologic    Bleeding problems: x Vaginal bleeding  Problems with blood clotting too easily:        Skin      Rashes or ulcers:        Constitutional    Fever or  chills:      PHYSICAL EXAM: Filed Vitals:   26-Feb-2016 1150 02/26/2016 1215 Feb 26, 2016 1333  BP: 110/71 122/77 138/119  Pulse: 133 146 134  Temp: 98.7 F (37.1 C)    TempSrc: Oral    Resp: SpO2: 91% 85% 95%    GENERAL: The patient is a well-nourished female, in no acute distress. The vital signs are documented above. CARDIAC: Irregularly irregular. Tachycardic.  VASCULAR: Palpable femoral pulses bilaterally. Non palpable popliteal pulses. Unable to doppler flow right foot. Palpable left posterior tibial pulse.  PULMONARY: There is good air exchange bilaterally. Non labored respiratory effort.  MUSCULOSKELETAL: Right foot is cool and pale. Right leg is warm at the thigh. Left leg warm.  NEUROLOGIC: Moving left foot, no motor function right foot. Sensation cannot be determined due to patient's mental status.  SKIN: There are no ulcers or rashes noted.  PSYCHIATRIC: The patient has dementia.    MEDICAL ISSUES: Acute ischemia of right lower extremity New onset atrial fibrillation with RVR  The patient was unable to get out of her chair to walk this am around 9:00-9:30 am secondary to right foot pain. She was walking earlier in the morning without difficulty. The patient has a history of dementia and the patient's daughter provides the history. She has never had atrial fibrillation in the past. She has palpable femoral pulses bilaterally. She has no doppler flow in the right foot. It is ischemic without motor function. She has a palpable left pedal pulse.   She has been started on a heparin drip and cardizem drip. Plan to take the patient emergently to the OR for right femoral embolectomy. Keep NPO. Obtain consent. She has been NPO since 7:30 am. Dr. Edilia Bo to evaluate patient soon.   Maris Berger, PA-C [facility] [place] [phone]

## 2016-02-06 NOTE — Anesthesia Procedure Notes (Signed)
Procedure Name: Intubation Date/Time: Jun 09, 2016 3:46 PM Performed by: Gwenyth AllegraADAMI, Tesia Lybrand Pre-anesthesia Checklist: Patient identified, Timeout performed, Emergency Drugs available, Suction available and Patient being monitored Patient Re-evaluated:Patient Re-evaluated prior to inductionOxygen Delivery Method: Circle system utilized Preoxygenation: Pre-oxygenation with 100% oxygen Intubation Type: IV induction and Rapid sequence Laryngoscope Size: Mac and 4 Grade View: Grade I Tube type: Oral Tube size: 7.5 mm Number of attempts: 1 Airway Equipment and Method: Stylet Placement Confirmation: ETT inserted through vocal cords under direct vision,  breath sounds checked- equal and bilateral and positive ETCO2 Secured at: 21 cm Tube secured with: Tape Dental Injury: Teeth and Oropharynx as per pre-operative assessment

## 2016-02-06 NOTE — ED Notes (Signed)
Per EMS, pt coming in from [facility] for a cold right leg and atrial fibrillation with an initial rate of 180. Pt reports right leg pain. Pt has alzheimer's and is at baseline per her facility. EMS administered 10mg  of Cardizem which brought pts rate to 130s. NAD at this time.

## 2016-02-06 NOTE — ED Notes (Signed)
Vascular at bedside

## 2016-02-06 NOTE — Progress Notes (Signed)
Dr Gentry RochJudd notified of patient coming out of OR on Neo without order. Patient BP 93/73. Dr Gentry RochJudd stated to stop Neo drip and decrease Cardizem to 2.5mg /hr. Ordered 250ml of albumin. Will continue to monitor.

## 2016-02-06 NOTE — ED Provider Notes (Addendum)
CSN: [id number]     Arrival date & time 08/04/2016  1145 History   First MD Initiated Contact with Patient 010/22/2017 1147     Chief Complaint  Patient presents with  . Tachycardia  . Cold Extremity     (Consider location/radiation/quality/duration/timing/severity/associated sxs/prior Treatment) HPI Reason presents from her nursing facility with concern for cold right foot. Patient has dementia, which limits the history of present illness. The patient herself does describe pain in the right lower extremity, but offers a convoluted story of her current situation. Patient denies pain anywhere else, but has very poor memory, quickly forgets the conversation, his repetitive. Per report the patient has no history of visual fibrillation, but was found earlier today complaining of right leg pain, and the staff found her foot to be cold.   Past Medical History  Diagnosis Date  . Vitamin D deficiency   . Vitamin B12 deficiency   . Abnormal uterine and vaginal bleeding, unspecified   . Gout   . Atherosclerosis of abdominal aorta (HCC)   . UTI (lower urinary tract infection)   . CHF (congestive heart failure) (HCC)   . Ceruminosis   . Dysphagia   . Iron deficiency anemia   . Alzheimer disease   . Depression   . Nephrolithiasis   . GERD (gastroesophageal reflux disease)   . Thyroid disease     hypo   No past surgical history on file. No family history on file. Social History  Substance Use Topics  . Smoking status: None  . Smokeless tobacco: None  . Alcohol Use: None   OB History    No data available     Review of Systems  Unable to perform ROS: Dementia      Allergies  Codeine and Vicodin  Home Medications   Prior to Admission medications   Medication Sig Start Date End Date Taking? Authorizing Provider  acetaminophen (TYLENOL) 325 MG tablet Take 650 mg by mouth every 6 (six) hours as needed for mild pain.   Yes Historical Provider, MD  citalopram (CELEXA) 20 MG  tablet Take 20 mg by mouth daily.   Yes Historical Provider, MD  colchicine 0.6 MG tablet Take 0.6 mg by mouth daily.   Yes Historical Provider, MD  cyanocobalamin (,VITAMIN B-12,) 1000 MCG/ML injection Inject 2,000 mcg into the muscle once.   Yes Historical Provider, MD  donepezil (ARICEPT) 10 MG tablet Take 10 mg by mouth at bedtime.   Yes Historical Provider, MD  ferrous sulfate 325 (65 FE) MG tablet Take 325 mg by mouth daily with breakfast.   Yes Historical Provider, MD  furosemide (LASIX) 20 MG tablet Take 30 mg by mouth See admin instructions. Monday-Friday   Yes Historical Provider, MD  haloperidol (HALDOL) 0.5 MG tablet Take 0.5 mg by mouth 2 (two) times daily as needed for agitation.   Yes Historical Provider, MD  levothyroxine (SYNTHROID, LEVOTHROID) 75 MCG tablet Take 75 mcg by mouth daily before breakfast.   Yes Historical Provider, MD  Melatonin 5 MG TABS Take 5 mg by mouth at bedtime.   Yes Historical Provider, MD  metoprolol succinate (TOPROL-XL) 25 MG 24 hr tablet Take 12.5 mg by mouth daily.   Yes Historical Provider, MD  pantoprazole (PROTONIX) 40 MG tablet Take 40 mg by mouth daily.   Yes Historical Provider, MD   BP 110/71 mmHg  Pulse 133  Temp(Src) 98.7 F (37.1 C) (Oral)  Resp 18  SpO2 91% Physical Exam  Constitutional: She appears well-developed and  well-nourished. No distress.  HENT:  Head: Normocephalic and atraumatic.  Eyes: Conjunctivae and EOM are normal.  Cardiovascular: An irregularly irregular rhythm present. Tachycardia present.   Normal pulses left lower extremity, right DP/PT pulses not appreciable.   Pulmonary/Chest: Effort normal and breath sounds normal. No stridor. No respiratory distress.  Abdominal: She exhibits no distension.  Musculoskeletal: She exhibits no edema.  Neurological: She is alert. No cranial nerve deficit.  Patient will not move her right foot or toes, but does do so on the left. She states that she can, she symptoms feels like  not doing so.  Skin: Skin is warm and dry.  Psychiatric: She has a normal mood and affect.  Nursing note and vitals reviewed.   ED Course  Procedures (including critical care time) Labs Review Labs Reviewed  PROTIME-INR - Abnormal; Notable for the following:    Prothrombin Time 15.3 (*)    All other components within normal limits  TROPONIN I - Abnormal; Notable for the following:    Troponin I 0.42 (*)    All other components within normal limits  BRAIN NATRIURETIC PEPTIDE - Abnormal; Notable for the following:    B Natriuretic Peptide 424.3 (*)    All other components within normal limits  CBC  MAGNESIUM  TSH  BASIC METABOLIC PANEL  HEPARIN LEVEL (UNFRACTIONATED)    Imaging Review Dg Chest Port 1 View  02/09/2016  CLINICAL DATA:  Chest pain. Cold, painful right leg. Atrial fibrillation. History of CHF. EXAM: PORTABLE CHEST 1 VIEW COMPARISON:  None. FINDINGS: The cardiac silhouette is mildly enlarged. Calcification and tortuosity are noted of the thoracic aorta. There is pulmonary vascular congestion with mild diffuse interstitial prominence. No sizable pleural effusion or pneumothorax is identified. Bilateral rib fractures are noted and appear old. A mid thoracic vertebral compression fracture is noted status post cement augmentation. IMPRESSION: Cardiomegaly with pulmonary vascular congestion and likely mild interstitial edema. Electronically Signed   By: Sebastian Ache M.D.   On: 02/10/2016 13:03   I have personally reviewed and evaluated these images and lab results as part of my medical decision-making.   EKG Interpretation   Date/Time:  Tuesday February 06 2016 11:46:05 EDT Ventricular Rate:  144 PR Interval:    QRS Duration: 94 QT Interval:  300 QTC Calculation: 464 R Axis:   -17 Text Interpretation:  Atrial fibrillation with rapid V-rate Borderline  left axis deviation Borderline low voltage, extremity leads Anteroseptal  infarct, old Repolarization abnormality, prob  rate related Atrial  fibrillation with rapid ventricular response Abnormal ekg Confirmed by  Gerhard Munch  MD ([id number]) on 01/28/2016 11:49:07 AM     Of concern for ischemic foot, given the patient's possible new atrial fibrillation I performed a quick bedside ultrasound.  EMERGENCY DEPARTMENT US VASCULAR INTERPRETATION "Study: Limited Ultrasound of the noted body part in comments below"  INDICATIONS: Pain and Other (refer to comments) Multiple views of the body part are obtained with a multi-frequency linear probe  PERFORMED BY:  Myself  IMAGES ARCHIVED?: Yes  SIDE:Right   BODY PART: R LE distal arteries  FINDINGS: In the posterior tibial artery there is forward flow, but the waveform is consistent with venous circulation, given the patient's atrial for ablation with tachycardia.   LIMITATIONS:  Limited patient cooperation, otherwise none  INTERPRETATION:  Abnormal perfusion  COMMENT:  Findings discussed with our vascular surgery team   1:54 PM Patient remains in similar condition.  Family present and aware.  HR remains elevated. Vasc surg has  seen the patient.   Patient has been on Cardizem since the initial evaluation, but is found to have a positive troponin as well, though she complains of no chest pain. This may be rate related, but with concern for both ongoing A. fib, possible thromboembolic disease, patient will start heparin.  MDM  Elderly female presents from her nursing facility with concern of cold distal extremity, arrhythmia. Patient has dementia, limiting the history of present illness. On exam, the patient is awake and alert, though a very poor historian. Patient is tachycardic, and with evidence for new atrial ablation, rapid ventricular response, there is some concern for distal embolic process. Patient has a cold foot, and cannot move the digits. After discussion with our vascular surgery team, patient was started on Cardizem for rate control. Patient  required emergent repair, with our vascular surgery colleagues.   CRITICAL CARE Performed by: Gerhard Munch Total critical care time: 45 minutes Critical care time was exclusive of separately billable procedures and treating other patients. Critical care was necessary to treat or prevent imminent or life-threatening deterioration. Critical care was time spent personally by me on the following activities: development of treatment plan with patient and/or surrogate as well as nursing, discussions with consultants, evaluation of patient's response to treatment, examination of patient, obtaining history from patient or surrogate, ordering and performing treatments and interventions, ordering and review of laboratory studies, ordering and review of radiographic studies, pulse oximetry and re-evaluation of patient's condition.   Gerhard Munch, MD 2016/02/12 1355  Gerhard Munch, MD 02-12-16 [phone]

## 2016-02-06 NOTE — Anesthesia Preprocedure Evaluation (Addendum)
Anesthesia Evaluation  Patient identified by MRN, date of birth, ID band Patient awake    Reviewed: Allergy & Precautions, NPO status , Patient's Chart, lab work & pertinent test results, reviewed documented beta blocker date and time   Airway Mallampati: III  TM Distance: >3 FB Neck ROM: Full    Dental  (+) Dental Advisory Given, Edentulous Upper, Edentulous Lower   Pulmonary neg pulmonary ROS,    Pulmonary exam normal breath sounds clear to auscultation       Cardiovascular hypertension, Pt. on medications and Pt. on home beta blockers (-) angina+ Peripheral Vascular Disease and +CHF  (-) CAD and (-) Past MI Normal cardiovascular exam+ dysrhythmias (on cardizem gtt) Atrial Fibrillation  Rhythm:Regular Rate:Normal     Neuro/Psych PSYCHIATRIC DISORDERS Depression Alzheimer's disease negative neurological ROS     GI/Hepatic Neg liver ROS, GERD  Medicated,  Endo/Other  negative endocrine ROSHypothyroidism   Renal/GU Renal InsufficiencyRenal disease  Female GU complaint (AUB)     Musculoskeletal negative musculoskeletal ROS (+)   Abdominal   Peds  Hematology negative hematology ROS (+)   Anesthesia Other Findings Day of surgery medications reviewed with the patient.  Reproductive/Obstetrics                           Anesthesia Physical Anesthesia Plan  ASA: IV and emergent  Anesthesia Plan: General   Post-op Pain Management:    Induction: Intravenous  Airway Management Planned: Oral ETT  Additional Equipment: Arterial line, CVP and Ultrasound Guidance Line Placement  Intra-op Plan:   Post-operative Plan: Extubation in OR  Informed Consent: I have reviewed the patients History and Physical, chart, labs and discussed the procedure including the risks, benefits and alternatives for the proposed anesthesia with the patient or authorized representative who has indicated his/her  understanding and acceptance.   Dental advisory given  Plan Discussed with: CRNA  Anesthesia Plan Comments: (Risks/benefits of general anesthesia discussed with patient including risk of damage to teeth, lips, gum, and tongue, nausea/vomiting, allergic reactions to medications, and the possibility of heart attack, stroke and death.  All patient questions answered.  Patient wishes to proceed.  A-line, possible CVL depending on peripheral access.)        Anesthesia Quick Evaluation

## 2016-02-06 NOTE — Op Note (Signed)
NAME: Valerie Cordova    MRN: [id number] DOB: 1932-07-09    DATE OF OPERATION: 01/15/2016  PREOP DIAGNOSIS: ischemic right lower extremity  POSTOP DIAGNOSIS: Same  PROCEDURE:  1. Right femoral embolectomy 2. Vein patch angioplasty right common femoral artery 3. Four compartment fasciotomy 4. Placement of negative pressure dressing on lateral fasciotomy site  SURGEON: Di Kindle. Edilia Bo, MD, FACS  ASSIST: Doreatha Massed, PA  ANESTHESIA: Gen.   EBL: minimal  INDICATIONS: Valerie Cordova is a 82 y.o. female who developed an acutely ischemic lower extremity today on the right. She had new onset atrial fibrillation. She is taken urgently to the operating and for attempted embolectomy.  FINDINGS: there was organized clot in the common femoral and superficial femoral arteries and also the proximal deep femoral artery. The catheter passed proximally 70 cm without obstruction. She had renal insufficiency and I did not do a completion arteriogram. She had a good anterior tibial and posterior tibial signal at the completion of the procedure.  TECHNIQUE: The patient was taken to the operating room and received a general anesthetic. Both groins and entire right lower extremity were prepped and draped in usual sterile fashion. Given her obesity I made an oblique incision in the right groin above the inguinal crease. Dissection was carried down to the common femoral artery which was dissected free circumferentially. It was soft but had clot in it. I then dissected distally in order to control the superficial femoral artery and deep femoral artery. The patient was heparinized. Common femoral artery was opened longitudinally after the arteries were clamped proximally and distally. Organized clot was retrieved directly. I then passed a 4:30 catheter proximal to times and no further clot was retrieved. The artery was clamped. I passed the catheter down the superficial femoral artery proximal 70 cm without  obstruction until no further clot was retrieved.The deep femoral artery had some clot proximally which I retrieved.The artery was irrigated with copious amounts of saline. A medial branch of the saphenous was mobilized and clipped proximally and distally. This was opened longitudinally and sewn as a vein patch using continuous 6-0 Prolene suture.At the completion was an excellent anterior tibial and posterior tibial signal with Doppler.  The cath seemed swollen. A longitudinal incision was made over the anterior lateral right leg and the dissection carried down to the lateral compartment. The fascia was opened and the mock muscle was moderately swollen. This was extended proximally and distally. I then mobilized anteriorly in order to enter the anterior compartment which was decompressed by opening the fascia. This was extended proximally and distally. There was too much tension to close this wound. Hemostasis was obtained in the wound. A- pressure dressing was placed here.  On the medial side, a longitudinal incision was made in the mid leg. The dissection was carried down to the superficial posterior compartment which was opened and decompressed. There was moderate swelling. I then continued dissection by taking the soleus off of the tibia to expose the deep compartment which had mild swelling. This wound was irrigated and hemostasis obtained. Deep layer was closed with running 2-0 Vicryl and the skin was closed staples. Sterile dressing was applied.  The groin had been closed with 2 deep layers of 2-0 Vicryl and the skin closed with a 40 septic or stitch. A 15 Blake drain had also been placed.  The patient tolerated the procedure well and was transferred to the recovery room in stable condition. All needle and sponge counts were correct.  Waverly Ferrarihristopher Eder Macek, MD, FACS [facility]  DATE OF DICTATION:   01/17/2016

## 2016-02-06 NOTE — Progress Notes (Signed)
ANTICOAGULATION CONSULT NOTE - Initial Consult  Pharmacy Consult for heparin Indication: new onset atrial fibrillation with acute embolus to right lower extremity  Allergies  Allergen Reactions  . Codeine   . Vicodin [Hydrocodone-Acetaminophen]     Patient Measurements:   Heparin Dosing Weight: ~69 kg  Vital Signs: Temp: 98.7 F (37.1 C) (04/25 1150) Temp Source: Oral (04/25 1150) BP: 138/119 mmHg (04/25 1333) Pulse Rate: 134 (04/25 1333)  Labs:  Recent Labs  11/27/15 1236  HGB 12.4  HCT 37.7  PLT 188  LABPROT 15.3*  INR 1.19    CrCl cannot be calculated (Unknown ideal weight.).   Medical History: Past Medical History  Diagnosis Date  . Vitamin D deficiency   . Vitamin B12 deficiency   . Abnormal uterine and vaginal bleeding, unspecified   . Gout   . Atherosclerosis of abdominal aorta (HCC)   . UTI (lower urinary tract infection)   . CHF (congestive heart failure) (HCC)   . Ceruminosis   . Dysphagia   . Iron deficiency anemia   . Alzheimer disease   . Depression   . Nephrolithiasis   . GERD (gastroesophageal reflux disease)   . Thyroid disease     hypo    Assessment: [age] yof with new onset atrial fibrillation with acute embolus to right lower extremity. Pharmacy consulted to start heparin. No AC pta. AF, wbc wnl, no bleed documented. Height and weight estimated per daughter in the ED. Wt: ~183 kg, Ht: ~5'2"  Goal of Therapy:  Heparin level 0.3-0.7 units/ml Monitor platelets by anticoagulation protocol: Yes   Plan:  Heparin 4000 unit bolus Start heparin at 1200 units/h 8h HL Daily HL/CBC Mon s/sx bleeding   Babs BertinHaley Aedin Jeansonne, PharmD, BCPS Clinical Pharmacist Pager [phone] 11/06/15 1:41 PM

## 2016-02-06 NOTE — Consult Note (Signed)
Patient ID: Valerie NettlesBetty Cordova MRN: [id number], DOB/AGE: 03/12/1932   Admit date: 01/23/2016   Primary Physician: PROVIDER NOT IN SYSTEM Primary Cardiologist: New (Dr. Mayford Knifeurner)  Pt. Profile:  82 y/o female with dementia but no prior cardiac history admitted for acute critical limb ischemia secondary to acute right LE arterial occlusion. Found to be in new onset atrial fibrillation w/ RVR.    Problem List  Past Medical History  Diagnosis Date  . Vitamin D deficiency   . Vitamin B12 deficiency   . Abnormal uterine and vaginal bleeding, unspecified   . Gout   . Atherosclerosis of abdominal aorta (HCC)   . UTI (lower urinary tract infection)   . CHF (congestive heart failure) (HCC)   . Ceruminosis   . Dysphagia   . Iron deficiency anemia   . Alzheimer disease   . Depression   . Nephrolithiasis   . GERD (gastroesophageal reflux disease)   . Thyroid disease     hypo    History reviewed. No pertinent past surgical history.   Allergies  Allergies  Allergen Reactions  . Codeine Itching and Nausea And Vomiting  . Vicodin [Hydrocodone-Acetaminophen] Itching and Nausea And Vomiting    HPI  82 y/o female with dementia but no prior cardiac history admitted for acute critical limb ischemia secondary to acute right LE arterial occlusion. Found to be in new onset atrial fibrillation. She was taken to OR emergently by vascular surgery for right femoral embolectomy per Dr. Edilia Boickson. Cardiology consulted for new onset atrial fibrillation.   Per medical records, there is no h/o CVA/TIA, HTN or vascular disease other than recent acute event. There is mention of CHF in PMH but I see no other records of this. Regardless, she has suffered an acute embolic event, likely secondary to afib and is considered high risk for recurrence. Also age is >75 and also with risk factor of female sex.   Her ventricular rate is also poorly controlled in the 130s. She is on IV Cardizem. Scr is 2.28. Troponin is  abnormal x 1 at 0.42. She is a bit loopy from recent surgery, thus history is limited. She denies any palpitations, CP of dyspnea currently. No prior cardiac issues that she is aware of. She notes significant improvement with resolution of leg pain.     Home Medications  Prior to Admission medications   Medication Sig Start Date End Date Taking? Authorizing Provider  acetaminophen (TYLENOL) 325 MG tablet Take 650 mg by mouth every 6 (six) hours as needed for mild pain.   Yes Historical Provider, MD  citalopram (CELEXA) 20 MG tablet Take 20 mg by mouth daily.   Yes Historical Provider, MD  colchicine 0.6 MG tablet Take 0.6 mg by mouth daily.   Yes Historical Provider, MD  cyanocobalamin (,VITAMIN B-12,) 1000 MCG/ML injection Inject 2,000 mcg into the muscle every 30 (thirty) days.    Yes Historical Provider, MD  donepezil (ARICEPT) 10 MG tablet Take 10 mg by mouth at bedtime.   Yes Historical Provider, MD  ferrous sulfate 325 (65 FE) MG tablet Take 325 mg by mouth daily with breakfast.   Yes Historical Provider, MD  furosemide (LASIX) 20 MG tablet Take 30 mg by mouth See admin instructions. Monday-Friday   Yes Historical Provider, MD  haloperidol (HALDOL) 0.5 MG tablet Take 0.5 mg by mouth 2 (two) times daily as needed for agitation.   Yes Historical Provider, MD  levothyroxine (SYNTHROID, LEVOTHROID) 75 MCG tablet Take 75 mcg by mouth  daily before breakfast.   Yes Historical Provider, MD  Melatonin 5 MG TABS Take 5 mg by mouth at bedtime.   Yes Historical Provider, MD  metoprolol succinate (TOPROL-XL) 25 MG 24 hr tablet Take 12.5 mg by mouth daily.   Yes Historical Provider, MD  pantoprazole (PROTONIX) 40 MG tablet Take 40 mg by mouth daily.   Yes Historical Provider, MD  sulfamethoxazole-trimethoprim (BACTRIM DS,SEPTRA DS) 800-160 MG tablet Take 2 tablets by mouth 2 (two) times daily. 02/02/16  Yes Historical Provider, MD    Family History  Family History  Problem Relation Age of Onset  .  Hypertension Mother     Social History  Social History   Social History  . Marital Status: Unknown    Spouse Name: N/A  . Number of Children: N/A  . Years of Education: N/A   Occupational History  . Not on file.   Social History Main Topics  . Smoking status: Not on file  . Smokeless tobacco: Not on file  . Alcohol Use: Not on file  . Drug Use: Not on file  . Sexual Activity: Not on file   Other Topics Concern  . Not on file   Social History Narrative  . No narrative on file     Review of Systems General:  No chills, fever, night sweats or weight changes.  Cardiovascular:  No chest pain, dyspnea on exertion, edema, orthopnea, palpitations, paroxysmal nocturnal dyspnea. Dermatological: No rash, lesions/masses Respiratory: No cough, dyspnea Urologic: No hematuria, dysuria Abdominal:   No nausea, vomiting, diarrhea, bright red blood per rectum, melena, or hematemesis Neurologic:  No visual changes, wkns, changes in mental status. All other systems reviewed and are otherwise negative except as noted above.  Physical Exam  Blood pressure 103/53, pulse 93, temperature 98.6 F (37 C), temperature source Oral, resp. rate 21, height  (1.575 m), weight 183 lb (83.008 kg), SpO2 95 %.  General: Pleasant, NAD Psych: Normal affect. Neuro: Alert and oriented X 3. Moves all extremities spontaneously. HEENT: Normal  Neck: Supple without bruits or JVD. Lungs:  Resp regular and unlabored, CTA. Heart: RRR no s3, s4, or murmurs. Abdomen: Soft, non-tender, non-distended, BS + x 4.  Extremities: No clubbing, cyanosis or edema. DP/PT/Radials 2+ and equal bilaterally.  Labs  Troponin (Point of Care Test) No results for input(s): TROPIPOC in the last 72 hours.  Recent Labs  02/10/2016 1236  TROPONINI 0.42*   Lab Results  Component Value Date   WBC 8.0 01/21/2016   HGB 12.4 01/24/2016   HCT 37.7 02/11/2016   MCV 92.9 01/14/2016   PLT 188 01/16/2016     Recent  Labs Lab 02/07/2016 1236  NA 139  K 3.6  CL 105  CO2 16*  BUN 11  CREATININE 2.28*  CALCIUM 8.7*  GLUCOSE 183*   No results found for: CHOL, HDL, LDLCALC, TRIG No results found for: DDIMER   Radiology/Studies  Dg Chest Port 1 View  02/11/2016  CLINICAL DATA:  Chest pain. Cold, painful right leg. Atrial fibrillation. History of CHF. EXAM: PORTABLE CHEST 1 VIEW COMPARISON:  None. FINDINGS: The cardiac silhouette is mildly enlarged. Calcification and tortuosity are noted of the thoracic aorta. There is pulmonary vascular congestion with mild diffuse interstitial prominence. No sizable pleural effusion or pneumothorax is identified. Bilateral rib fractures are noted and appear old. A mid thoracic vertebral compression fracture is noted status post cement augmentation. IMPRESSION: Cardiomegaly with pulmonary vascular congestion and likely mild interstitial edema. Electronically Signed  By: Sebastian Ache M.D.   On: 03/03/2016 13:03    ECG  Atrial fibrillation w/ RVR   ASSESSMENT AND PLAN  Principal Problem:   Critical lower limb ischemia Active Problems:   Atrial fibrillation with RVR (HCC)   Ischemia of lower extremity   1. Acute Critical Limb Ischemia: in the setting of new onset atrial fibrillation w/ RVR, thus presumably from cardio embolic source. She is s/p emergent right femoral artery embolectomy per Dr. Edilia Bo. Continue post op management per vascular surgery.   2. New Onset Atrial Fibrillation w/ RVR: ventricular rate currently in the 120s. Continue IV Cardizem for rate control. Based on known risk factors, her calculated CHA2DS2 VASc score is 5 for Age >4, embolic event and female sex. Given embolic event, she is at high risk for recurrence and high risk for stroke. She will require anticoagulation, once cleared to start by vascular surgery. Continue IV heparin for now. Her admit SCr is 2.28. We have no baseline labs for comparison. Check f/u BMP in the am to reassess. Based  on renal function currently, she is not a candidate for NOAC. Would have to use Coumadin with heparin bridge. Would also check a 2D echo to assess LVF and LA size.    2. Abnormal Troponin: initial troponin is abnormal at 0.42. This may be secondary to demand ischemia in the setting of rapid Afib. However will need to cycle x 3 to assess trend. If further increase, she may require further evaluation to r/o underlying coronary ischemia. However may be limited to perform cath given renal function. There are no baseline EKGs for comparison. She is currently on IV heparin for afib.   3. Renal Insufficiency: ? AKI vs chronic renal insuffiencey. There are no baseline labs for comparison. Obtain a f/u BMP in the am. Stop diuretic.   Beau Fanny, PA-C Mar 03, 2016, 6:24 PM   Patient seen and examined with Robbie Lis, PA. We discussed all aspects of the encounter. I agree with the assessment and plan as stated above with minor changes.  Patient with no prior cardiac history who presents with acute embolism to extremity and found to be in new onset afib with RVR of unknown duration.  Patient now s/p LE embolectomy.  She remains in afib with HR fairly controlled in the 90's on IV Cardizem gtt. Will continue on IV Cardizem and IV Heparin gtt for now. Plan 2D echo in am to assess LVF.  Acute renal insuff in setting of diuretic therapy.  Will hold diuretics and reassess BMET in am.  CHADS2VASC score is 5 so will need long term anticoagulation.  Given her renal insuff will not be a candidate for NOAC at this time unless renal function improves.   Signed: Armanda Magic, MD [facility] Mar 03, 2016

## 2016-02-06 NOTE — ED Notes (Signed)
Spoke with main lab to add BMP onto existing blood work.

## 2016-02-06 NOTE — Transfer of Care (Signed)
Immediate Anesthesia Transfer of Care Note  Patient: Dorthula NettlesBetty Hightower  Procedure(s) Performed: Procedure(s): RIGHT FEMORAL EMBOLECTOMY (Right) VEIN PATCH ANGIOPLASTY (Right) RIGHT LOWER LEG FASCIOTOMY  (Right) APPLICATION OF WOUND VAC (Right)  Patient Location: PACU  Anesthesia Type:General  Level of Consciousness: awake and alert   Airway & Oxygen Therapy: Patient Spontanous Breathing, Patient connected to nasal cannula oxygen and Patient connected to face mask oxygen  Post-op Assessment: Report given to RN and Post -op Vital signs reviewed and stable  Post vital signs: Reviewed and stable  Last Vitals:  Filed Vitals:   May 28, 2016 1445 May 28, 2016 1743  BP: 105/74 124/79  Pulse:  94  Temp:  37 C  Resp: 27 23    Complications: No apparent anesthesia complications

## 2016-02-07 ENCOUNTER — Inpatient Hospital Stay (HOSPITAL_COMMUNITY): Payer: Medicare (Managed Care)

## 2016-02-07 ENCOUNTER — Encounter (HOSPITAL_COMMUNITY): Payer: Medicare (Managed Care)

## 2016-02-07 DIAGNOSIS — R778 Other specified abnormalities of plasma proteins: Secondary | ICD-10-CM | POA: Diagnosis not present

## 2016-02-07 DIAGNOSIS — R7989 Other specified abnormal findings of blood chemistry: Secondary | ICD-10-CM | POA: Diagnosis not present

## 2016-02-07 DIAGNOSIS — R34 Anuria and oliguria: Secondary | ICD-10-CM

## 2016-02-07 DIAGNOSIS — I5021 Acute systolic (congestive) heart failure: Secondary | ICD-10-CM | POA: Diagnosis not present

## 2016-02-07 DIAGNOSIS — I509 Heart failure, unspecified: Secondary | ICD-10-CM

## 2016-02-07 DIAGNOSIS — I959 Hypotension, unspecified: Secondary | ICD-10-CM

## 2016-02-07 DIAGNOSIS — D62 Acute posthemorrhagic anemia: Secondary | ICD-10-CM

## 2016-02-07 DIAGNOSIS — I4891 Unspecified atrial fibrillation: Secondary | ICD-10-CM

## 2016-02-07 LAB — BLOOD GAS, ARTERIAL
ACID-BASE DEFICIT: 4.7 mmol/L — AB (ref 0.0–2.0)
Bicarbonate: 19.3 mEq/L — ABNORMAL LOW (ref 20.0–24.0)
DRAWN BY: 244861
O2 Content: 4 L/min
O2 SAT: 95.7 %
PATIENT TEMPERATURE: 98.1
PO2 ART: 79.9 mmHg — AB (ref 80.0–100.0)
TCO2: 20.3 mmol/L (ref 0–100)
pCO2 arterial: 32 mmHg — ABNORMAL LOW (ref 35.0–45.0)
pH, Arterial: 7.397 (ref 7.350–7.450)

## 2016-02-07 LAB — ECHOCARDIOGRAM COMPLETE
Height: 62 in
WEIGHTICAEL: 3072.33 [oz_av]

## 2016-02-07 LAB — CBC
HCT: 34.5 % — ABNORMAL LOW (ref 36.0–46.0)
HEMATOCRIT: 23.1 % — AB (ref 36.0–46.0)
HEMOGLOBIN: 7.6 g/dL — AB (ref 12.0–15.0)
Hemoglobin: 11.5 g/dL — ABNORMAL LOW (ref 12.0–15.0)
MCH: 31 pg (ref 26.0–34.0)
MCH: 30.6 pg (ref 26.0–34.0)
MCHC: 33.3 g/dL (ref 30.0–36.0)
MCHC: 32.9 g/dL (ref 30.0–36.0)
MCV: 93 fL (ref 78.0–100.0)
MCV: 93.1 fL (ref 78.0–100.0)
PLATELETS: 153 10*3/uL (ref 150–400)
Platelets: 113 10*3/uL — ABNORMAL LOW (ref 150–400)
RBC: 3.71 MIL/uL — ABNORMAL LOW (ref 3.87–5.11)
RBC: 2.48 MIL/uL — ABNORMAL LOW (ref 3.87–5.11)
RDW: 14 % (ref 11.5–15.5)
RDW: 14.3 % (ref 11.5–15.5)
WBC: 8.7 10*3/uL (ref 4.0–10.5)
WBC: 7.9 10*3/uL (ref 4.0–10.5)

## 2016-02-07 LAB — URINE CULTURE

## 2016-02-07 LAB — PROCALCITONIN: PROCALCITONIN: 1.18 ng/mL

## 2016-02-07 LAB — BASIC METABOLIC PANEL
ANION GAP: 12 (ref 5–15)
Anion gap: 13 (ref 5–15)
BUN: 15 mg/dL (ref 6–20)
BUN: 17 mg/dL (ref 6–20)
CHLORIDE: 98 mmol/L — AB (ref 101–111)
CO2: 18 mmol/L — AB (ref 22–32)
CO2: 23 mmol/L (ref 22–32)
Calcium: 7.6 mg/dL — ABNORMAL LOW (ref 8.9–10.3)
Calcium: 5.6 mg/dL — CL (ref 8.9–10.3)
Chloride: 107 mmol/L (ref 101–111)
Creatinine, Ser: 2.41 mg/dL — ABNORMAL HIGH (ref 0.44–1.00)
Creatinine, Ser: 2.93 mg/dL — ABNORMAL HIGH (ref 0.44–1.00)
GFR calc Af Amer: 21 mL/min — ABNORMAL LOW (ref >60)
GFR, EST AFRICAN AMERICAN: 16 mL/min — AB (ref >60)
GFR, EST NON AFRICAN AMERICAN: 18 mL/min — AB (ref >60)
GFR, EST NON AFRICAN AMERICAN: 14 mL/min — AB (ref >60)
GLUCOSE: 124 mg/dL — AB (ref 65–99)
Glucose, Bld: 98 mg/dL (ref 65–99)
POTASSIUM: 3.7 mmol/L (ref 3.5–5.1)
Potassium: 5.4 mmol/L — ABNORMAL HIGH (ref 3.5–5.1)
SODIUM: 133 mmol/L — AB (ref 135–145)
Sodium: 138 mmol/L (ref 135–145)

## 2016-02-07 LAB — LACTIC ACID, PLASMA
LACTIC ACID, VENOUS: 1.8 mmol/L (ref 0.5–2.0)
Lactic Acid, Venous: 1.7 mmol/L (ref 0.5–2.0)

## 2016-02-07 LAB — HEPARIN LEVEL (UNFRACTIONATED): HEPARIN UNFRACTIONATED: 0.9 [IU]/mL — AB (ref 0.30–0.70)

## 2016-02-07 LAB — PROTIME-INR
INR: 1.43 (ref 0.00–1.49)
PROTHROMBIN TIME: 17.5 s — AB (ref 11.6–15.2)

## 2016-02-07 LAB — TROPONIN I
TROPONIN I: 0.94 ng/mL — AB (ref <0.031)
TROPONIN I: 0.68 ng/mL — AB (ref <0.031)
Troponin I: 0.99 ng/mL (ref <0.031)

## 2016-02-07 LAB — MAGNESIUM: MAGNESIUM: 1.2 mg/dL — AB (ref 1.7–2.4)

## 2016-02-07 LAB — APTT: aPTT: 73 seconds — ABNORMAL HIGH (ref 24–37)

## 2016-02-07 MED ORDER — FUROSEMIDE 10 MG/ML IJ SOLN
40.0000 mg | Freq: Once | INTRAMUSCULAR | Status: AC
  Administered 2016-02-07: 40 mg via INTRAVENOUS
  Filled 2016-02-07: qty 4

## 2016-02-07 MED ORDER — AMIODARONE HCL IN DEXTROSE 360-4.14 MG/200ML-% IV SOLN
INTRAVENOUS | Status: AC
  Administered 2016-02-08: 200 mL
  Filled 2016-02-07: qty 400

## 2016-02-07 MED ORDER — SODIUM CHLORIDE 0.9 % IV SOLN
INTRAVENOUS | Status: DC
  Administered 2016-02-07 – 2016-02-08 (×2): via INTRAVENOUS

## 2016-02-07 MED ORDER — SODIUM CHLORIDE 0.9 % IV BOLUS (SEPSIS): 250.0000 mL | Freq: Once | INTRAVENOUS | Status: DC

## 2016-02-07 MED ORDER — PIPERACILLIN-TAZOBACTAM IN DEX 2-0.25 GM/50ML IV SOLN
2.2500 g | Freq: Three times a day (TID) | INTRAVENOUS | Status: DC
  Administered 2016-02-07 – 2016-02-10 (×8): 2.25 g via INTRAVENOUS
  Filled 2016-02-07 (×11): qty 50

## 2016-02-07 MED ORDER — SODIUM CHLORIDE 0.9 % IV BOLUS (SEPSIS)
500.0000 mL | Freq: Once | INTRAVENOUS | Status: AC
  Administered 2016-02-07: 500 mL via INTRAVENOUS

## 2016-02-07 MED ORDER — MAGNESIUM SULFATE 50 % IJ SOLN
3.0000 g | Freq: Once | INTRAVENOUS | Status: DC
  Filled 2016-02-07: qty 6

## 2016-02-07 MED ORDER — SODIUM CHLORIDE 0.9 % IV SOLN
2.0000 g | Freq: Once | INTRAVENOUS | Status: AC
  Administered 2016-02-07: 2 g via INTRAVENOUS
  Filled 2016-02-07: qty 20

## 2016-02-07 MED ORDER — VANCOMYCIN HCL IN DEXTROSE 1-5 GM/200ML-% IV SOLN
1000.0000 mg | INTRAVENOUS | Status: DC
  Administered 2016-02-07: 1000 mg via INTRAVENOUS
  Filled 2016-02-07: qty 200

## 2016-02-07 MED ORDER — HEPARIN (PORCINE) IN NACL 100-0.45 UNIT/ML-% IJ SOLN
700.0000 [IU]/h | INTRAMUSCULAR | Status: DC
Start: 2016-02-08 — End: 2016-02-08

## 2016-02-07 MED ORDER — DILTIAZEM HCL 100 MG IV SOLR
5.0000 mg/h | INTRAVENOUS | Status: DC
  Administered 2016-02-07: 5 mg/h via INTRAVENOUS

## 2016-02-07 MED ORDER — SODIUM CHLORIDE 0.9 % IV BOLUS (SEPSIS): 500.0000 mL | Freq: Once | INTRAVENOUS | Status: DC

## 2016-02-07 MED ORDER — SODIUM CHLORIDE 0.9 % IV BOLUS (SEPSIS): 250.0000 mL | Freq: Four times a day (QID) | INTRAVENOUS | Status: DC | PRN

## 2016-02-07 NOTE — Consult Note (Addendum)
[facility] History and Physical  Valerie NettlesBetty Cordova WUJ:[id number]:[id number] DOB: 11/12/1931 DOA: 01/28/2016  Referring physician: Chuck Hint*Christopher S Dickson, MD PCP: PROVIDER NOT IN SYSTEM   Reason for consult-medical management  HPI:  82 y/o female with dementia but no prior cardiac history admitted for acute critical limb ischemia secondary to acute right LE arterial occlusion. Found to be in new onset atrial fibrillation. She was taken to OR emergently by vascular surgery for right femoral embolectomy per Dr. Edilia Boickson On 4/25. Cardiology consulted for new onset atrial fibrillation. Hospitalist consulted for medical management. Patient found to have systolic blood pressures in the 80s over the last 24 hours with oliguric renal failure. Daughter in the room states that the patient has a history of multiple kidney stones. [place].Baseline creatinine unknown. Patient was found to be lethargic and inability to stand on her feet to be able to go to her nursing home for rehabilitation yesterday. Per medical records, there is no h/o CVA/TIA, HTN or vascular disease other than recent acute event. There is mention of CHF in PMH but I see no other records of this. Regardless, she has suffered an acute embolic event, likely secondary to afib and is considered high risk for recurrence. Also age is >75 and also with risk factor of female sex.  Patient seen back cardiology for atrial fibrillation with rapid ventricular response .Started on IV Cardizem. Scr is 2.28 And trending up. Troponin is abnormal x 1 at 0.42 And trending up. S. No prior cardiac issues that she is aware of. She notes significant improvement with resolution of leg pain. patient also found to have some hematuria since yesterday.        Review of Systems: negative for the following  Unable to obtain due to advanced dementia   Past Medical History  Diagnosis Date  . Vitamin D deficiency   . Vitamin B12  deficiency   . Abnormal uterine and vaginal bleeding, unspecified   . Gout   . Atherosclerosis of abdominal aorta (HCC)   . UTI (lower urinary tract infection)   . CHF (congestive heart failure) (HCC)   . Ceruminosis   . Dysphagia   . Iron deficiency anemia   . Alzheimer disease   . Depression   . Nephrolithiasis   . GERD (gastroesophageal reflux disease)   . Thyroid disease     hypo     History reviewed. No pertinent past surgical history.    Social History:  has no tobacco, alcohol, and drug history on file.   Allergies  Allergen Reactions  . Codeine Itching and Nausea And Vomiting  . Vicodin [Hydrocodone-Acetaminophen] Itching and Nausea And Vomiting    Family History  Problem Relation Age of Onset  . Hypertension Mother        Prior to Admission medications   Medication Sig Start Date End Date Taking? Authorizing Provider  acetaminophen (TYLENOL) 325 MG tablet Take 650 mg by mouth every 6 (six) hours as needed for mild pain.   Yes Historical Provider, MD  citalopram (CELEXA) 20 MG tablet Take 20 mg by mouth daily.   Yes Historical Provider, MD  colchicine 0.6 MG tablet Take 0.6 mg by mouth daily.   Yes Historical Provider, MD  cyanocobalamin (,VITAMIN B-12,) 1000 MCG/ML injection Inject 2,000 mcg into the muscle every 30 (thirty) days.    Yes Historical Provider, MD  donepezil (ARICEPT) 10 MG tablet Take 10 mg by mouth at bedtime.   Yes Historical Provider, MD  ferrous sulfate 325 (65 FE) MG tablet Take 325 mg by mouth daily with breakfast.   Yes Historical Provider, MD  furosemide (LASIX) 20 MG tablet Take 30 mg by mouth See admin instructions. Monday-Friday   Yes Historical Provider, MD  haloperidol (HALDOL) 0.5 MG tablet Take 0.5 mg by mouth 2 (two) times daily as needed for agitation.   Yes Historical Provider, MD  levothyroxine (SYNTHROID, LEVOTHROID) 75 MCG tablet Take 75 mcg by mouth daily before breakfast.   Yes Historical Provider, MD  Melatonin 5 MG TABS  Take 5 mg by mouth at bedtime.   Yes Historical Provider, MD  metoprolol succinate (TOPROL-XL) 25 MG 24 hr tablet Take 12.5 mg by mouth daily.   Yes Historical Provider, MD  pantoprazole (PROTONIX) 40 MG tablet Take 40 mg by mouth daily.   Yes Historical Provider, MD  sulfamethoxazole-trimethoprim (BACTRIM DS,SEPTRA DS) 800-160 MG tablet Take 2 tablets by mouth 2 (two) times daily. 02/02/16  Yes Historical Provider, MD     Physical Exam: Filed Vitals:   02/07/16 1615 02/07/16 1630 02/07/16 1645 02/07/16 1700  BP: 94/78  Pulse: 78 97 75 76  Temp:      TempSrc:      Resp: Height:      Weight:      SpO2: 90% 82% 93% 97%     Constitutional: Vital signs reviewed. Patient is a Obese, chronically ill-appearing,no acute distress and cooperative with exam. Alert andoriented to self, unable to recognize her daughter  Head: Normocephalic and atraumatic  Ear: TM normal bilaterally  Mouth: no erythema or exudates, MMM  Eyes: PERRL, EOMI, conjunctivae normal, No scleral icterus.  Neck: Supple, Trachea midline normal ROM, No JVD, mass, thyromegaly, or carotid bruit present.  Cardiovascular: RRR, S1 normal, S2 normal, no MRG, pulses symmetric and intact bilaterally  Pulmonary/Chest: CTAB, no wheezes, rales, or rhonchi  Abdominal: Soft. Non-tender, non-distended, bowel sounds are normal, no masses, organomegaly, or guarding present.  GU: no CVA tenderness Musculoskeletal: No joint deformities, erythema, or stiffness, ROM full and no nontender Ext: Right lower extremity with a drain, pulses palpable bilaterally (DP and PT)  Hematology: no cervical, inginal, or axillary adenopathy.  Neurological: confused Strenght is normal and symmetric bilaterally, cranial nerve II-XII are grossly intact, no focal motor deficit, sensory intact to light touch bilaterally.  Skin: Warm, dry and intact. No rash, cyanosis, or clubbing.  Psychiatric: Normal mood and affect. speech and  behavior is normal. Judgment and thought content normal. Cognition and memory are normal.      Data Review   Micro Results Recent Results (from the past 240 hour(s))  Urine culture     Status: Abnormal   Collection Time: 02/07/16  8:05 PM  Result Value Ref Range Status   Specimen Description URINE, RANDOM  Final   Special Requests NONE  Final   Culture 6,000 COLONIES/mL INSIGNIFICANT GROWTH (A)  Final   Report Status 02/07/2016 FINAL  Final  MRSA PCR Screening     Status: Abnormal   Collection Time: 2016-02-07  8:45 PM  Result Value Ref Range Status   MRSA by PCR POSITIVE (A) NEGATIVE Final    Comment:        The GeneXpert MRSA Assay (FDA approved for NASAL specimens only), is one component of a comprehensive MRSA colonization surveillance program. It is not intended to diagnose MRSA infection nor to guide or monitor treatment for MRSA infections. RESULT CALLED TO, READ BACK BY AND  VERIFIED WITH: R.MUELLER,RN AT 2346 BY L.PITT 02/07/2016     Radiology Reports Dg Chest Port 1 View  01/31/2016  CLINICAL DATA:  Chest pain. Cold, painful right leg. Atrial fibrillation. History of CHF. EXAM: PORTABLE CHEST 1 VIEW COMPARISON:  None. FINDINGS: The cardiac silhouette is mildly enlarged. Calcification and tortuosity are noted of the thoracic aorta. There is pulmonary vascular congestion with mild diffuse interstitial prominence. No sizable pleural effusion or pneumothorax is identified. Bilateral rib fractures are noted and appear old. A mid thoracic vertebral compression fracture is noted status post cement augmentation. IMPRESSION: Cardiomegaly with pulmonary vascular congestion and likely mild interstitial edema. Electronically Signed   By: Sebastian Ache M.D.   On: 01/19/2016 13:03     CBC  Recent Labs Lab 01/19/2016 1236 02/07/16 0128  WBC 8.0 8.7  HGB 12.4 11.5*  HCT 37.7 34.5*  PLT 188 153  MCV 92.9 93.0  MCH 30.5 31.0  MCHC 32.9 33.3  RDW 13.6 14.0    Chemistries    Recent Labs Lab 01/30/2016 1236 02/07/16 0128  NA 139 138  K 3.6 5.4*  CL 105 107  CO2 16* 18*  GLUCOSE 183* 124*  BUN 11 15  CREATININE 2.28* 2.41*  CALCIUM 8.7* 7.6*  MG 1.7  --    ------------------------------------------------------------------------------------------------------------------ estimated creatinine clearance is 18.8 mL/min (by C-G formula based on Cr of 2.41). ------------------------------------------------------------------------------------------------------------------ No results for input(s): HGBA1C in the last 72 hours. ------------------------------------------------------------------------------------------------------------------ No results for input(s): CHOL, HDL, LDLCALC, TRIG, CHOLHDL, LDLDIRECT in the last 72 hours. ------------------------------------------------------------------------------------------------------------------  Recent Labs  01/20/2016 1236  TSH 0.377   ------------------------------------------------------------------------------------------------------------------ No results for input(s): VITAMINB12, FOLATE, FERRITIN, TIBC, IRON, RETICCTPCT in the last 72 hours.  Coagulation profile  Recent Labs Lab 01/27/2016 1236  INR 1.19    No results for input(s): DDIMER in the last 72 hours.  Cardiac Enzymes  Recent Labs Lab 01/19/2016 1236 02/07/16 0926 02/07/16 1437  TROPONINI 0.42* 0.99* 0.94*   ------------------------------------------------------------------------------------------------------------------ Invalid input(s): POCBNP   CBG:  Recent Labs Lab 01/29/2016 1750  GLUCAP 194*       EKG: Independently reviewed. *A. Fib    Assessment/Plan Principal Problem:   Critical lower limb ischemia Status post Right femoral embolectomy by Dr. Edilia Bo Currently on anticoagulation with a heparin drip Management per vascular surgery  Acute renal failure, Likely ATN, oliguric, baseline creatinine  unknown Continue when necessary fluid boluses, Keep MAP greater than 65 May need Vasopressors if does not improve with fluid boluses  Sepsis physiology, Present on admission Started on broad-spectrum antibiotics, draw blood culture 2, Recently treated for UTI, follow results of urine culture, blood culture Sepsis protocol initiated   New Onset Atrial Fibrillation w/ RVR: ventricular rate currently in the 70s. Continue IV Cardizem for rate control. Based on known risk factors, her calculated CHA2DS2 VASc score is 5 for Age >47, embolic event and female sex. Given embolic event, she is at high risk for recurrence and high risk for stroke. She will require anticoagulation,  . Continue IV heparin for now.     Acute CHF (congestive heart failure) (HCC)-Likely diastolic, 2-D echo pending     Elevated troponin-Cardiology following and patient on a heparin drip currently  dementia-daughter states that she has no advance directives and she is a full code at this time, Continue full scope of treatment   CODE STATUS full code         Family Communication: bedside Disposition Plan: admit   Total time spent 55 minutes.Greater than 50%  of this time was spent in counseling, explanation of diagnosis, planning of further management, and coordination of care  [facility] Pager [phone]  If 7PM-7AM, please contact night-coverage [email or web address] Password [facility] 02/07/2016, 5:47 PM

## 2016-02-07 NOTE — Consult Note (Signed)
PULMONARY / CRITICAL CARE MEDICINE   Name: Valerie Cordova MRN: [id number] DOB: 1932-04-01    ADMISSION DATE:  03-Mar-2016 CONSULTATION DATE:  02/07/2016  REFERRING MD:  Dr. Susie Cassette  CHIEF COMPLAINT:  Oliguric, hypotensive  HISTORY OF PRESENT ILLNESS:   Ms. Valerie Cordova is a 82 y.o. woman with medical history as detailed below.  Admitted on 4/25.  She has history of dementia and unable to contribute to HPI.  Daughter is present in the room.  She has no significant cardiac history other than CHF per the chart, but came in with acute critical limb ischemia secondary to an acute right lower extremity arterial occlusion.  She was also found to be in new onset atrial fibrillation with RVR.  Went to OR on 4/25 for emergent right femoral artery embolectomy with Dr. Edilia Bo.  Her atrial fibrillation has been managed thus far by cardiology.  She is on heparin gtt and cardizem gtt, plus beta blocker PRN.  She also had a TTE on 4/26 with LVEF 40-45%, diffuse hypokinesis, grade 2 DD.    Since hospitalization, patient also has put out very little urine ( today) and her renal function has deteriorated with rising serum creatine.  She has also been having hematuria since yesterday.  Per review of her recent records from [place], creatine was 1.1 in February and 1.5 earlier this month.  She has history of recurrent UTI (currently being treated with Bactrim outpatient and recently finished course of fluoroquinolone) with E. Coli and Enterococcus on culture from [place].  She also has history of kidney stones and follows with a urologist.  PCCM has been consulted due to concern for hypotension and acute renal injury.  PAST MEDICAL HISTORY :  She  has a past medical history of Vitamin D deficiency; Vitamin B12 deficiency; Abnormal uterine and vaginal bleeding, unspecified; Gout; Atherosclerosis of abdominal aorta (HCC); UTI (lower urinary tract infection); CHF (congestive heart failure) (HCC); Ceruminosis; Dysphagia;  Iron deficiency anemia; Alzheimer disease; Depression; Nephrolithiasis; GERD (gastroesophageal reflux disease); and Thyroid disease.  PAST SURGICAL HISTORY: She  has no past surgical history on file.  Allergies  Allergen Reactions  . Codeine Itching and Nausea And Vomiting  . Vicodin [Hydrocodone-Acetaminophen] Itching and Nausea And Vomiting    No current facility-administered medications on file prior to encounter.   No current outpatient prescriptions on file prior to encounter.    FAMILY HISTORY:  Her has no family status information on file.   SOCIAL HISTORY: She    REVIEW OF SYSTEMS:   Unable to obtain due to patients dementia  SUBJECTIVE:  Resting in bed, eyes closed, will follow commands, having some right-sided back pain.  VITAL SIGNS: BP 102/60 mmHg  Pulse 46  Temp(Src) 98.8 F (37.1 C) (Oral)  Resp 18  Ht  (1.575 m)  Wt 192 lb 0.3 oz (87.1 kg)  BMI 35.11 kg/m2  SpO2 88%  HEMODYNAMICS:    VENTILATOR SETTINGS:    INTAKE / OUTPUT: I/O last 3 completed shifts: In: 3461.9 [I.V.:3111.9; IV Piggyback:350] Out: 925 [Urine:625; Drains:280; Blood:20]  PHYSICAL EXAMINATION: General: resting in bed, not in distress, elderly appearing woman HEENT: /AT, anicteric sclera Cardiac: irregular rhythm, regular rate, no rubs, murmurs or gallops Pulm: no increased work of breathing, moving normal volumes of air Abd: soft, nontender, nondistended Ext: warm and well perfused, no pedal edema, right leg wrapped in Ace bandage Neuro: no focal deficits, alert, cooperative, following commands   LABS:  BMET  Recent Labs Lab 03/03/2016 1236 02/07/16 0128  02/07/16 2059  NA 139 138 133*  K 3.6 5.4* 3.7  CL 105 107 98*  CO2 16* 18* 23  BUN 11 15 17   CREATININE 2.28* 2.41* 2.93*  GLUCOSE 183* 124* 98    Electrolytes  Recent Labs Lab 02/04/2016 1236 02/07/16 0128 02/07/16 2059  CALCIUM 8.7* 7.6* 5.6*  MG 1.7  --  1.2*    CBC  Recent Labs Lab  02/02/2016 1236 02/07/16 0128  WBC 8.0 8.7  HGB 12.4 11.5*  HCT 37.7 34.5*  PLT 188 153    Coag's  Recent Labs Lab 01/13/2016 1236 02/07/16 1848  APTT  --  73*  INR 1.19 1.43    Sepsis Markers  Recent Labs Lab 02/07/16 1848 02/07/16 2059  LATICACIDVEN 1.8 1.7  PROCALCITON 1.18  --     ABG No results for input(s): PHART, PCO2ART, PO2ART in the last 168 hours.  Liver Enzymes No results for input(s): AST, ALT, ALKPHOS, BILITOT, ALBUMIN in the last 168 hours.  Cardiac Enzymes  Recent Labs Lab 02/07/16 0926 02/07/16 1437 02/07/16 2059  TROPONINI 0.99* 0.94* 0.68*    Glucose  Recent Labs Lab 02/08/2016 1750  GLUCAP 194*    Imaging Dg Chest Port 1 View  02/07/2016  CLINICAL DATA:  Sepsis. EXAM: PORTABLE CHEST 1 VIEW COMPARISON:  February 06, 2016. FINDINGS: Stable cardiomegaly. No pneumothorax or pleural effusion is noted. No acute pulmonary disease is noted. Status post kyphoplasty of mid thoracic vertebral body. Old left rib fractures are noted. IMPRESSION: No acute cardiopulmonary abnormality seen. Electronically Signed   By: Lupita RaiderJames  Green Jr, M.D.   On: 02/07/2016 18:26     STUDIES:  4/25 CXR > Cardiomegaly with pulmonary vascular congestion and likely mild interstitial edema 4/26 Renal ultrasound >  4/26 CT abdomen w/o contrast >   CULTURES: 4/26 Blood cx > 4/25 Urine cx > 6,000 colonies/mL insignificant growth   ANTIBIOTICS: Vancomycin 4/26 > Zosyn 4/26 > TMP-SMX 4/25 > 4/25  SIGNIFICANT EVENTS: 4/25 Admit with critical limb ischemia, taken to OR with Dr. Edilia Boickson.  Also found to have atrial fibrillation with RVR so cardiology was consulted 4/26 [facility] consulted.  Worsening renal function and some hypotension prompting PCCM consult   LINES/TUBES: Foley 4/25 PIV x 3 4/25 Wound Vac 4/25  DISCUSSION: 82 y/o female with dementia but no prior cardiac history admitted for acute critical limb ischemia secondary to acute right LE arterial occlusion.  Found to be in new onset atrial fibrillation. She was taken to OR emergently by vascular surgery for right femoral embolectomy per Dr. Edilia Boickson On 4/25. Cardiology consulted for new onset atrial fibrillation. Hospitalist consulted for medical management. Patient found to have systolic blood pressures in the 80s over the last 24 hours with oliguric renal failure  ASSESSMENT / PLAN:  PULMONARY A: No acute issues P:   - Supplemental oxygen as needed  CARDIOVASCULAR A:  New Onset Atrial Fibrillation w/ RVR - management per cardiology Hypotension Elevated troponin Critical Limb Ischemia s/p right femoral embolectomy Non-sustained V-tach Acute CHF - likely more diastolic.  Echo results as above P:  - cardizem gtt and heparin gtt per cardiology - small IV fluid bolus prn plus maintenance - currently, MAP & BP is okay but if MAP < 65 consistently could consider taking off cardizem gtt and adding amiodarone for rhythm control plus additional benefit given her non-sustained v-tach - trend troponin, already on heparin gtt - hold on diuresis given renal function - watch fluid balance   RENAL A:  Acute renal failure - unclear etiology at this point.  Diff Dx includes: ATN from hypotension, obstruction, medication (TMP-SMX prior to admit).  Given her normal creatine in February, do not suspect chronic insufficiency Hypocalcemia Hypomagnesium AG Metabolic Acidosis - better P:   - continue to follow BMET, mag, phos - check FeNa - f/u renal ultrasound, CT abdomen/pelvis - correct electrolytes - follow UOP, I/O - likely will need renal consult  GASTROINTESTINAL A:   No acute issues P:   - clear liquid diet - PPI  HEMATOLOGIC A:   DVT PPx P:  - heparin gtt  INFECTIOUS A:   ? Urinary tract infection with ? Sepsis physiology P:   - she has a normal WBC, afebrile, normal lactic acid, and has been treated outpatient for UTI with Bactrim and Fluoroquinolone - recent urine culture  looks okay - vanc and zosyn added by [facility], consider de-escalating - follow CBC, fevers  ENDOCRINE A:   Hypothyroidism   P:   - continue synthroid  NEUROLOGIC A:   Hx of Alzheimers Hx of Depression P:   - Aricept QHS - Celexa daily - RASS goal: 0 - Code status discussion actively ongoing with family    FAMILY  - Updates: family at bedside  - Inter-disciplinary family meet or Palliative Care meeting due by:  day 7    Pulmonary and Critical Care Medicine [facility] Pager: [phone]  02/07/2016, 10:25 PM

## 2016-02-07 NOTE — Progress Notes (Signed)
Multiple 4-5 beat runs of V-Tach. Cardiology fellow paged and spoke with as well as Dr. Hart RochesterLawson with [facility]. Dr. Hart RochesterLawson to consult CCM. Will continue to closely monitor.  Modena JanskyKevin Kanden Carey RN [address] [facility]

## 2016-02-07 NOTE — Progress Notes (Signed)
ANTICOAGULATION CONSULT NOTE   Pharmacy Consult for heparin Indication: new onset atrial fibrillation with acute embolus to right lower extremity  Allergies  Allergen Reactions  . Codeine Itching and Nausea And Vomiting  . Vicodin [Hydrocodone-Acetaminophen] Itching and Nausea And Vomiting    Patient Measurements: Height: 5\' 2"  (157.5 cm) Weight: 192 lb 0.3 oz (87.1 kg) IBW/kg (Calculated) : 50.1 Heparin Dosing Weight: ~69 kg  Vital Signs: Temp: 98.9 F (37.2 C) (04/26 2215) Temp Source: Oral (04/26 1942) BP: 94/63 mmHg (04/26 2215) Pulse Rate: 85 (04/26 2215)  Labs:  Recent Labs  01/23/2016 1236 02/07/16 0128 02/07/16 0926 02/07/16 1437 02/07/16 1848 02/07/16 2059  HGB 12.4 11.5*  --   --   --  7.6*  HCT 37.7 34.5*  --   --   --  23.1*  PLT 188 153  --   --   --  PENDING  APTT  --   --   --   --  73*  --   LABPROT 15.3*  --   --   --  17.5*  --   INR 1.19  --   --   --  1.43  --   HEPARINUNFRC  --   --  0.90*  --   --  >2.12*  CREATININE 2.28* 2.41*  --   --   --  2.93*  TROPONINI 0.42*  --  0.99* 0.94*  --  0.68*    Estimated Creatinine Clearance: 15.4 mL/min (by C-G formula based on Cr of 2.93).   Assessment: [age] yof with new onset atrial fibrillation with acute embolus to right lower extremity. Initially started on heparin on 4/25 that was stopped due R femoral embolectomy and 4 compartment fasciotomy. Heparin was resumed last evening per MD but pharmacy now consulted to assist with dosing. Initial HL 0.90, good draw per RN with no sxs of bleeding.  Repeat HL is > 2.12 on heparin 950 units/hr and pt's Hgb has dropped significantly since AM labs. Nurse reports no issues with the infusion or obvious bleeding. Will hold heparin and restart at lower dose while watching Hgb very closely.  Goal of Therapy:  Heparin level 0.3-0.7 units/ml Monitor platelets by anticoagulation protocol: Yes   Plan:  Hold heparin for 1.5 hours then reduce heparin to 700 units/hr 8h  HL Daily HL Monitor s/sx of bleeding   Arlean Hoppingorey M. Newman PiesBall, PharmD, BCPS Clinical Pharmacist Pager [phone]  02/07/2016, 10:41 PM

## 2016-02-07 NOTE — Progress Notes (Signed)
ANTICOAGULATION CONSULT NOTE   Pharmacy Consult for heparin Indication: new onset atrial fibrillation with acute embolus to right lower extremity  Allergies  Allergen Reactions  . Codeine Itching and Nausea And Vomiting  . Vicodin [Hydrocodone-Acetaminophen] Itching and Nausea And Vomiting    Patient Measurements: Height: 5\' 2"  (157.5 cm) Weight: 192 lb 0.3 oz (87.1 kg) IBW/kg (Calculated) : 50.1 Heparin Dosing Weight: ~69 kg  Vital Signs: Temp: 98.5 F (36.9 C) (04/26 0400) BP: 84/51 mmHg (04/26 0929) Pulse Rate: 76 (04/26 0929)  Labs:  Recent Labs  02/08/2016 1236 02/07/16 0128 02/07/16 0926  HGB 12.4 11.5*  --   HCT 37.7 34.5*  --   PLT 188 153  --   LABPROT 15.3*  --   --   INR 1.19  --   --   HEPARINUNFRC  --   --  0.90*  CREATININE 2.28* 2.41*  --   TROPONINI 0.42*  --  0.99*    Estimated Creatinine Clearance: 18.8 mL/min (by C-G formula based on Cr of 2.41).   Assessment: [age] yof with new onset atrial fibrillation with acute embolus to right lower extremity. Initially started on heparin on 4/25 that was stopped due R femoral embolectomy and 4 compartment fasciotomy. Heparin was resumed last evening per MD but pharmacy now consulted to assist with dosing. Initial HL 0.90, good draw per RN with no sxs of bleeding.  Goal of Therapy:  Heparin level 0.3-0.7 units/ml Monitor platelets by anticoagulation protocol: Yes   Plan:  1. Decrease heparin to 950 units/hr 2. HL in 8 hours 3. Daily HL and CBC 4. Noted plans by cardiology to transition to warfarin in future    Pollyann SamplesAndy Corwin Kuiken, PharmD, BCPS 02/07/2016, 11:00 AM Pager: [phone]

## 2016-02-07 NOTE — Progress Notes (Addendum)
ANTIBIOTIC CONSULT NOTE - INITIAL  Pharmacy Consult for zosyn  Indication: UTI  Allergies  Allergen Reactions  . Codeine Itching and Nausea And Vomiting  . Vicodin [Hydrocodone-Acetaminophen] Itching and Nausea And Vomiting    Patient Measurements: Height: 5\' 2"  (157.5 cm) Weight: 192 lb 0.3 oz (87.1 kg) IBW/kg (Calculated) : 50.1   Vital Signs: Temp: 97.3 F (36.3 C) (04/26 1600) Temp Source: Axillary (04/26 1600) BP: 94/78 mmHg (04/26 1700) Pulse Rate: 76 (04/26 1700) Intake/Output from previous day: 04/25 0701 - 04/26 0700 In: 1686.7 [I.V.:1386.7; IV Piggyback:300] Out: 655 [Urine:495; Drains:140; Blood:20] Intake/Output from this shift: Total I/O In: 1475.2 [I.V.:1475.2] Out: 270 [Urine:130; Drains:140]  Labs:  Recent Labs  01/31/2016 1236 02/07/16 0128  WBC 8.0 8.7  HGB 12.4 11.5*  PLT 188 153  CREATININE 2.28* 2.41*   Estimated Creatinine Clearance: 18.8 mL/min (by C-G formula based on Cr of 2.41). No results for input(s): VANCOTROUGH, VANCOPEAK, VANCORANDOM, GENTTROUGH, GENTPEAK, GENTRANDOM, TOBRATROUGH, TOBRAPEAK, TOBRARND, AMIKACINPEAK, AMIKACINTROU, AMIKACIN in the last 72 hours.   Microbiology: Recent Results (from the past 720 hour(s))  Urine culture     Status: Abnormal   Collection Time: 01/29/2016  8:05 PM  Result Value Ref Range Status   Specimen Description URINE, RANDOM  Final   Special Requests NONE  Final   Culture 6,000 COLONIES/mL INSIGNIFICANT GROWTH (A)  Final   Report Status 02/07/2016 FINAL  Final  MRSA PCR Screening     Status: Abnormal   Collection Time: 01/13/2016  8:45 PM  Result Value Ref Range Status   MRSA by PCR POSITIVE (A) NEGATIVE Final    Comment:        The GeneXpert MRSA Assay (FDA approved for NASAL specimens only), is one component of a comprehensive MRSA colonization surveillance program. It is not intended to diagnose MRSA infection nor to guide or monitor treatment for MRSA infections. RESULT CALLED TO, READ  BACK BY AND VERIFIED WITH: R.MUELLER,RN AT 2346 BY L.PITT 01/19/2016     Medical History: Past Medical History  Diagnosis Date  . Vitamin D deficiency   . Vitamin B12 deficiency   . Abnormal uterine and vaginal bleeding, unspecified   . Gout   . Atherosclerosis of abdominal aorta (HCC)   . UTI (lower urinary tract infection)   . CHF (congestive heart failure) (HCC)   . Ceruminosis   . Dysphagia   . Iron deficiency anemia   . Alzheimer disease   . Depression   . Nephrolithiasis   . GERD (gastroesophageal reflux disease)   . Thyroid disease     hypo    Assessment: [age] YOF with new onset atrial fibrillation with acute embolus to right lower extremity s/p right femoral embolectomy. She was on bactrim PTA for UTI, which was stopped d/t hyperkalemia. Pharmacy is consulted to start zosyn empirically for r/o urosepsis. Est. crcl ~ 15-20 ml/min  4/25 Urine - insignificant growth 4/26 blood cx x 2-    Plan:  - zosyn 2.25 g IV Q 8 hrs - f/u cultures and clinical improvement - consider narrow therapy if appropriate.  Bayard HuggerMei Brenly Trawick, PharmD, BCPS  Clinical Pharmacist  Pager: [phone]   02/07/2016,5:18 PM   Addendum: Will also add vancomycin d/t concern of sepsis, pt has been hypotensive  Plan: Vancomycin 1g IV Q 48 hrs  Bayard HuggerMei Nelida Mandarino, PharmD, BCPS  Clinical Pharmacist  Pager: [phone]

## 2016-02-07 NOTE — Progress Notes (Addendum)
Pt with approx 100 ml urine output since 7 am. Dr. Edilia Boickson paged to update.  Will continue to monitor. Asher Muir- Arjan Strohm,RN    1501: Dr. Edilia Boickson paged again re: low urine output. Pt also had 5 beat run of V-tach.  Asher Muir- Dempsy Damiano,RN

## 2016-02-07 NOTE — Progress Notes (Signed)
  Echocardiogram 2D Echocardiogram has been performed.  Delcie RochENNINGTON, Reena Borromeo 02/07/2016, 5:16 PM

## 2016-02-07 NOTE — Clinical Documentation Improvement (Signed)
  Vascular Surgery  Can the diagnosis of acute renal failure be further specified?   Acute Renal Failure/Acute Kidney Injury  Acute Tubular Necrosis  Acute Renal Cortical Necrosis  Acute Renal Medullary Necrosis  Acute on Chronic Renal Failure  Chronic Renal Failure  Other  Clinically Undetermined  Document any associated diagnoses/conditions.   Supporting Information: Creatinine 2.28 and 2.4.  GFR 19   Please exercise your independent, professional judgment when responding. A specific answer is not anticipated or expected.   Thank Modesta MessingYou,  Almando Brawley [facility]  [phone]

## 2016-02-07 NOTE — Progress Notes (Signed)
Per Cardiology NP will continue to monitor BP closely. If BP drops in the 70s systolic will give 250cc bolus. Will continue to monitor pt closely.

## 2016-02-07 NOTE — Progress Notes (Signed)
      I called in a IM consult.  Dr. Susie CassetteAbrol will see her.

## 2016-02-07 NOTE — Progress Notes (Signed)
CRITICAL VALUE ALERT  Critical value received:  Troponin 0.99  Date of notification:  02/07/16  Time of notification:  1037  Critical value read back:Yes.    Nurse who received alert:  Lavona MoundJamie Clayborn Milnes,RN   MD notified (1st page):  Dr. Mayford Knifeurner, cardiology  Time of first page:  1037  MD notified (2nd page):  Time of second page:  Responding MD:    Time MD responded:

## 2016-02-07 NOTE — Progress Notes (Addendum)
1545 Called Dr. Hart RochesterLawson about pts BP and UO. Was isntructed to give a 500cc bolus and call Dr. Adele Danickson's office number.   [id number] Spoke with Brynda Rimarol RN at Dr. Adele Danickson's office. Reported to her the pts BP and UO. Awaiting orders. Will continue to monitor closely.   1601 instructed to consult cardiology per Dr. Adele Danickson's RN. Cardiology paged.  1615 Per Dr.Dickson's RN he is consulting [facility] and will have them come and see the pt. Will continue to monitor closely.

## 2016-02-07 NOTE — Progress Notes (Signed)
SUBJECTIVE:  Converted to NSR last night with one brief run of PAF overnight.  No complaints this am.  OBJECTIVE:   Vitals:   Filed Vitals:   02/07/16 0400 02/07/16 0500 02/07/16 0600 02/07/16 0700  BP: 106/67 93/58 96/57  105/83  Pulse: 78 81 78 76  Temp: 98.5 F (36.9 C)     TempSrc:      Resp: Height:      Weight:      SpO2: 100% 98% 100%    I&O's:   Intake/Output Summary (Last 24 hours) at 02/07/16 0803 Last data filed at 02/07/16 0600  Gross per 24 hour  Intake 1686.72 ml  Output    655 ml  Net 1031.72 ml   TELEMETRY: Reviewed telemetry pt in NSR:     PHYSICAL EXAM General: Well developed, well nourished, in no acute distress Head: Eyes PERRLA, No xanthomas.   Normal cephalic and atramatic  Lungs:   Clear bilaterally to auscultation and percussion. Heart:   HRRR S1 S2 Pulses are 2+ & equal. Abdomen: Bowel sounds are positive, abdomen soft and non-tender without masses Extremities:   No clubbing, cyanosis or edema.  DP +1 Neuro: Alert and oriented X 3. Psych:  Good affect, responds appropriately   LABS: Basic Metabolic Panel:  Recent Labs  16/10/96 1236 02/07/16 0128  NA 139 138  K 3.6 5.4*  CL 105 107  CO2 16* 18*  GLUCOSE 183* 124*  BUN 11 15  CREATININE 2.28* 2.41*  CALCIUM 8.7* 7.6*  MG 1.7  --    Liver Function Tests: No results for input(s): AST, ALT, ALKPHOS, BILITOT, PROT, ALBUMIN in the last 72 hours. No results for input(s): LIPASE, AMYLASE in the last 72 hours. CBC:  Recent Labs  2016/02/15 1236 02/07/16 0128  WBC 8.0 8.7  HGB 12.4 11.5*  HCT 37.7 34.5*  MCV 92.9 93.0  PLT 188 153   Cardiac Enzymes:  Recent Labs  02/15/16 1236  TROPONINI 0.42*   BNP: Invalid input(s): POCBNP D-Dimer: No results for input(s): DDIMER in the last 72 hours. Hemoglobin A1C: No results for input(s): HGBA1C in the last 72 hours. Fasting Lipid Panel: No results for input(s): CHOL, HDL, LDLCALC, TRIG, CHOLHDL, LDLDIRECT in the  last 72 hours. Thyroid Function Tests:  Recent Labs  02/15/16 1236  TSH 0.377   Anemia Panel: No results for input(s): VITAMINB12, FOLATE, FERRITIN, TIBC, IRON, RETICCTPCT in the last 72 hours. Coag Panel:   Lab Results  Component Value Date   INR 1.19 15-Feb-2016    RADIOLOGY: Dg Chest Port 1 View  02-15-16  CLINICAL DATA:  Chest pain. Cold, painful right leg. Atrial fibrillation. History of CHF. EXAM: PORTABLE CHEST 1 VIEW COMPARISON:  None. FINDINGS: The cardiac silhouette is mildly enlarged. Calcification and tortuosity are noted of the thoracic aorta. There is pulmonary vascular congestion with mild diffuse interstitial prominence. No sizable pleural effusion or pneumothorax is identified. Bilateral rib fractures are noted and appear old. A mid thoracic vertebral compression fracture is noted status post cement augmentation. IMPRESSION: Cardiomegaly with pulmonary vascular congestion and likely mild interstitial edema. Electronically Signed   By: Sebastian Ache M.D.   On: 15-Feb-2016 13:03    ASSESSMENT AND PLAN  Principal Problem:  Critical lower limb ischemia Active Problems:  Atrial fibrillation with RVR (HCC)  Ischemia of lower extremity   1. Acute Critical Limb Ischemia: in the setting of new onset atrial fibrillation w/ RVR, thus presumably from cardio embolic source.  She is s/p emergent right femoral artery embolectomy per Dr. Edilia Boickson. Continue post op management per vascular surgery.   2. New Onset Atrial Fibrillation w/ RVR: ventricular rate currently in the 70s. Continue IV Cardizem for rate control. Based on known risk factors, her calculated CHA2DS2 VASc score is 5 for Age 8>75, embolic event and female sex. Given embolic event, she is at high risk for recurrence and high risk for stroke. She will require anticoagulation, once cleared to start by vascular surgery. Continue IV heparin for now. Her admit SCr is 2.28. We have no baseline labs for comparison.  BMP this am  is up to 2.4. Based on renal function currently, she is not a candidate for NOAC. Would have to use Coumadin with heparin bridge. Will check a 2D echo to assess LVF and LA size. Keep on Cardizem gtt today and then stop tomorrow and increase home BB dose if no further PAF.    2. Abnormal Troponin: initial troponin is abnormal at 0.42. This may be secondary to demand ischemia in the setting of rapid Afib and renal insufficiency. However will need to cycle x 3 to assess trend. If further increase, she may require further evaluation to [facility]/o underlying coronary ischemia. However may be limited to perform cath given renal function. There are no baseline EKGs for comparison. She is currently on IV heparin for afib.   3. Renal Insufficiency: ? AKI vs chronic renal insuffiencey. There are no baseline labs for comparison. BMET this am shows increasing creatinine.    4.  Acute CHF - likely diastolic.  Most likely exacerbated by acute renal insuff and afib with RVR.  BNP mildly elevated and chest xray yesterday showed pulmonary vascular congestion with mild interstitial edema.  2D echo pending.  She got 1 dose of IV Lasix this am.  Will hold on further diuretics for now given increased creatinine.  Follow trend of renal function.  Will try to get old records to determine what baseline creatinine is.    Valerie [facility],Valerie Pedrosa R, MD  02/07/2016  8:03 AM

## 2016-02-07 NOTE — Evaluation (Signed)
Occupational Therapy Evaluation Patient Details Name: Valerie NettlesBetty [facility] MRN: [id number] DOB: [birth year] Today's Date: 02/07/2016    History of Present Illness 82 y.o. female with Alzheimers admitted for acute critical limb ischemia secondary to acute right LE arterial occlusion. Found to be in new onset atrial fibrillation w/ RVR. Pt now s/p right femoral embolectomy, vein patch angioplasty femoral artery, and 4 compartment fasciotomy. PMH includes UTI, gout, CHF, dysphagia, Alzheimers, depression, hypothyroid disease, GERD, gout.   Clinical Impression   Pt s/p above. Pt getting assist for ADLs, PTA. Feel pt will benefit from acute OT to increase independence prior to d/c. Recommending SNF upon d/c.    Follow Up Recommendations  SNF;Supervision/Assistance - 24 hour    Equipment Recommendations  Other (comment) (defer to next venue)    Recommendations for Other Services       Precautions / Restrictions Precautions Precautions: Fall Restrictions Weight Bearing Restrictions: No      Mobility Bed Mobility Overal bed mobility: Needs Assistance Bed Mobility: Supine to Sit;Sit to Supine     Supine to sit: +2 for physical assistance;Total assist Sit to supine: +2 for physical assistance;Mod assist   General bed mobility comments: Assist to bring legs off bed and elevate trunk into sitting. Assist to bring legs back up into bed and lower trunk.  Transfers                 General transfer comment: not assessed    Balance Min guard-Max assist for sitting balance on EOB                               ADL Overall ADL's : Needs assistance/impaired Eating/Feeding: Bed level Eating/Feeding Details (indicate cue type and reason): pt able to swallow jello but would not feed herself with utensil; OT fed her Grooming: Sitting;Total assistance           Upper Body Dressing : Total assistance;Sitting   Lower Body Dressing: Total assistance;Bed level;Sitting/lateral  leans                 General ADL Comments: Pt not following commands to wash face or to feed herself.      Vision     Perception     Praxis      Pertinent Vitals/Pain Pain Assessment: Faces Faces Pain Scale: Hurts little more Pain Location: Rt LE Pain Descriptors / Indicators: verbalized it hurt in session Pain Intervention(s): Monitored during session;Repositioned     Hand Dominance     Extremity/Trunk Assessment Upper Extremity Assessment Upper Extremity Assessment: Difficult to assess due to impaired cognition   Lower Extremity Assessment Lower Extremity Assessment: Defer to PT evaluation       Communication Communication Communication: No difficulties (difficulty following commands but able to speak)   Cognition Arousal/Alertness:  (lethargic at times) Behavior During Therapy: WFL for tasks assessed/performed Overall Cognitive Status: No family/caregiver present to determine baseline cognitive functioning Area of Impairment: Memory               General Comments: Pleasant but not following commands.    General Comments       Exercises       Shoulder Instructions      Home Living Family/patient expects to be discharged to:: Skilled nursing facility  Additional Comments: spoke with daughter- pt lives with daughter and goes to [facility]      Prior Functioning/Environment Level of Independence: Needs assistance  Gait / Transfers Assistance Needed: assist with transfers but could normally get around ADL's / Homemaking Assistance Needed: spoke with daughter and pt was able to wash her face and feed herself, PTA; pt could sometimes perform toilet hygiene   Comments: OT called daughter and asked about PLOF    OT Diagnosis: Generalized weakness;Acute pain   OT Problem List: Decreased strength;Decreased activity tolerance;Impaired balance (sitting and/or standing);Decreased  cognition;Decreased safety awareness;Decreased knowledge of use of DME or AE;Decreased knowledge of precautions;Pain   OT Treatment/Interventions: Self-care/ADL training;DME and/or AE instruction;Therapeutic activities;Cognitive remediation/compensation;Patient/family education;Balance training    OT Goals(Current goals can be found in the care plan section) Acute Rehab OT Goals Patient Stated Goal: did not state OT Goal Formulation: With family Time For Goal Achievement: 02/21/16 Potential to Achieve Goals: Fair ADL Goals Pt Will Perform Eating: with set-up;with supervision;sitting Pt Will Transfer to Toilet: with mod assist;bedside commode;ambulating Pt Will Perform Toileting - Clothing Manipulation and hygiene: sit to/from stand;with mod assist  OT Frequency: Min 2X/week   Barriers to D/C:            Co-evaluation PT/OT/SLP Co-Evaluation/Treatment: Yes Reason for Co-Treatment: For patient/therapist safety PT goals addressed during session: Mobility/safety with mobility;Balance OT goals addressed during session: Other (comment) (mobility)      End of Session Equipment Utilized During Treatment:  (placed O2 on her towards end of session)  Activity Tolerance: Patient limited by fatigue Patient left: in bed;with call bell/phone within reach;with bed alarm set   Time: 1610-9604 OT Time Calculation (min): 17 min Charges:  OT General Charges $OT Visit: 1 Procedure OT Evaluation $OT Eval Moderate Complexity: 1 Procedure G-CodesEarlie Raveling OTR/L [phone] 02/07/2016, 1:26 PM

## 2016-02-07 NOTE — Progress Notes (Addendum)
   VASCULAR SURGERY ASSESSMENT & PLAN:   1 Day Post-Op s/p: Right femoral embolectomy,vein patch angioplasty femoral artery, and 4 compartment fasciotomy.   Continue IV heparin. Long-term anticoagulation per cardiology.   CARDIAC: the patient had presented with new onset atrial fibrillation. Cardiology is following.   RENAL: The patient is being hydrated and receiving Lasix for rhabdomyolysis. Renal function has worsened slightly.   Elevate legs   VAC change on Monday Wednesday and Friday.   JP = 140 cc. Leave for now.   SUBJECTIVE: No complaints.  PHYSICAL EXAM: Filed Vitals:   02/07/16 0345 02/07/16 0400 02/07/16 0500 02/07/16 0600  BP: 100/71 106/67 93/58 96/57   Pulse: 129 78 81 78  Temp:  98.5 F (36.9 C)    TempSrc:      Resp: 19 18 17 19   Height:      Weight:      SpO2: 92% 100% 98% 100%   Good Doppler signal right DP and posterior tibial.  LABS: Lab Results  Component Value Date   WBC 8.7 02/07/2016   HGB 11.5* 02/07/2016   HCT 34.5* 02/07/2016   MCV 93.0 02/07/2016   PLT 153 02/07/2016   Lab Results  Component Value Date   CREATININE 2.41* 02/07/2016   Lab Results  Component Value Date   INR 1.19 03-Aug-2016   CBG (last 3)   Recent Labs  05-03-16 1750  GLUCAP 194*    Principal Problem:   Critical lower limb ischemia Active Problems:   Atrial fibrillation with RVR (HCC)   Ischemia of lower extremity   Cari CarawayChris Dickson Beeper: [phone] 02/07/2016

## 2016-02-07 NOTE — Evaluation (Signed)
Physical Therapy Evaluation Patient Details Name: Valerie NettlesBetty [facility] MRN: [id number] DOB: [birth year] Today's Date: 02/07/2016   History of Present Illness  Pt adm with ischemic RLE and new onset A-fib. Underwent    Right femoral embolectomy,vein patch angioplasty femoral artery, and 4 compartment fasciotomy. PMH - alzheimers, depression, chf  Clinical Impression  Pt admitted with above diagnosis and presents to PT with functional limitations due to deficits listed below (See PT problem list). Pt needs skilled PT to maximize independence and safety to allow discharge to SNF.      Follow Up Recommendations SNF    Equipment Recommendations  None recommended by PT    Recommendations for Other Services       Precautions / Restrictions Precautions Precautions: Fall Restrictions Weight Bearing Restrictions: No      Mobility  Bed Mobility Overal bed mobility: Needs Assistance Bed Mobility: Supine to Sit;Sit to Supine     Supine to sit: +2 for physical assistance;Total assist Sit to supine: +2 for physical assistance;Mod assist   General bed mobility comments: Assist to bring legs off bed and elevate trunk into sitting. Assist to bring legs back up into bed and lower trunk.  Transfers                    Ambulation/Gait                Stairs            Wheelchair Mobility    Modified Rankin (Stroke Patients Only)       Balance Overall balance assessment: Needs assistance Sitting-balance support: Bilateral upper extremity supported;Feet supported Sitting balance-Leahy Scale: Poor Sitting balance - Comments: Sat x 8-10 minutes with min guard to max A. Pt at times leaning posteriorly but appeared due to cognition Postural control: Posterior lean                                   Pertinent Vitals/Pain Pain Assessment: Faces Faces Pain Scale: Hurts little more Pain Location: RLE Pain Descriptors / Indicators: Grimacing Pain  Intervention(s): Limited activity within patient's tolerance;Monitored during session;Repositioned;Premedicated before session    Home Living Family/patient expects to be discharged to:: Skilled nursing facility                      Prior Function           [facility] Unsure of prior functional level     Hand Dominance        Extremity/Trunk Assessment   Upper Extremity Assessment: Defer to OT evaluation           Lower Extremity Assessment: Difficult to assess due to impaired cognition (Active movement noted throughout)         Communication      Cognition Arousal/Alertness: Awake/alert Behavior During Therapy: WFL for tasks assessed/performed Overall Cognitive Status: Impaired/Different from baseline Area of Impairment: Memory               General Comments: Pleasant but not following commands.     General Comments      Exercises        Assessment/Plan    PT Assessment Patient needs continued PT services  PT Diagnosis Difficulty walking;Generalized weakness;Acute pain   PT Problem List Decreased strength;Decreased activity tolerance;Decreased balance;Decreased mobility;Decreased knowledge of use of DME;Pain  PT Treatment Interventions DME instruction;Gait training;Therapeutic activities;Functional mobility training;Balance  training;Patient/family education   PT Goals (Current goals can be found in the Care Plan section) Acute Rehab PT Goals Patient Stated Goal: Pt didn't state PT Goal Formulation: Patient unable to participate in goal setting Time For Goal Achievement: 02/21/16 Potential to Achieve Goals: Fair    Frequency Min 2X/week   Barriers to discharge        Co-evaluation PT/OT/SLP Co-Evaluation/Treatment: Yes Reason for Co-Treatment: For patient/therapist safety PT goals addressed during session: Mobility/safety with mobility;Balance         End of Session   Activity Tolerance: Patient limited  by fatigue Patient left: in bed;with call bell/phone within reach;with bed alarm set Nurse Communication: Mobility status         Time: 1610-9604 PT Time Calculation (min) (ACUTE ONLY): 20 min   Charges:   PT Evaluation $PT Eval Moderate Complexity: 1 Procedure     PT G Codes:        Valerie [facility] Feb 14, 2016, 11:37 AM Skip Mayer PT [phone]

## 2016-02-08 ENCOUNTER — Inpatient Hospital Stay (HOSPITAL_COMMUNITY): Payer: Medicare (Managed Care)

## 2016-02-08 ENCOUNTER — Encounter (HOSPITAL_COMMUNITY): Payer: Self-pay | Admitting: Radiology

## 2016-02-08 DIAGNOSIS — N179 Acute kidney failure, unspecified: Secondary | ICD-10-CM

## 2016-02-08 DIAGNOSIS — D62 Acute posthemorrhagic anemia: Secondary | ICD-10-CM

## 2016-02-08 DIAGNOSIS — J9601 Acute respiratory failure with hypoxia: Secondary | ICD-10-CM

## 2016-02-08 DIAGNOSIS — I5021 Acute systolic (congestive) heart failure: Secondary | ICD-10-CM

## 2016-02-08 DIAGNOSIS — R34 Anuria and oliguria: Secondary | ICD-10-CM | POA: Insufficient documentation

## 2016-02-08 DIAGNOSIS — I959 Hypotension, unspecified: Secondary | ICD-10-CM

## 2016-02-08 LAB — BASIC METABOLIC PANEL
ANION GAP: 12 (ref 5–15)
ANION GAP: 12 (ref 5–15)
ANION GAP: 14 (ref 5–15)
BUN: 25 mg/dL — ABNORMAL HIGH (ref 6–20)
BUN: 27 mg/dL — AB (ref 6–20)
BUN: 29 mg/dL — AB (ref 6–20)
CHLORIDE: 105 mmol/L (ref 101–111)
CHLORIDE: 105 mmol/L (ref 101–111)
CHLORIDE: 102 mmol/L (ref 101–111)
CO2: 18 mmol/L — AB (ref 22–32)
CO2: 18 mmol/L — ABNORMAL LOW (ref 22–32)
CO2: 18 mmol/L — ABNORMAL LOW (ref 22–32)
CREATININE: 4.11 mg/dL — AB (ref 0.44–1.00)
Calcium: 7.4 mg/dL — ABNORMAL LOW (ref 8.9–10.3)
Calcium: 7.4 mg/dL — ABNORMAL LOW (ref 8.9–10.3)
Calcium: 7.5 mg/dL — ABNORMAL LOW (ref 8.9–10.3)
Creatinine, Ser: 4.54 mg/dL — ABNORMAL HIGH (ref 0.44–1.00)
Creatinine, Ser: 4.99 mg/dL — ABNORMAL HIGH (ref 0.44–1.00)
GFR calc Af Amer: 10 mL/min — ABNORMAL LOW (ref >60)
GFR calc Af Amer: 9 mL/min — ABNORMAL LOW (ref >60)
GFR calc non Af Amer: 9 mL/min — ABNORMAL LOW (ref >60)
GFR calc non Af Amer: 8 mL/min — ABNORMAL LOW (ref >60)
GFR, EST AFRICAN AMERICAN: 11 mL/min — AB (ref >60)
GFR, EST NON AFRICAN AMERICAN: 7 mL/min — AB (ref >60)
GLUCOSE: 112 mg/dL — AB (ref 65–99)
Glucose, Bld: 108 mg/dL — ABNORMAL HIGH (ref 65–99)
Glucose, Bld: 115 mg/dL — ABNORMAL HIGH (ref 65–99)
POTASSIUM: 4.6 mmol/L (ref 3.5–5.1)
POTASSIUM: 4.9 mmol/L (ref 3.5–5.1)
POTASSIUM: 4.8 mmol/L (ref 3.5–5.1)
SODIUM: 134 mmol/L — AB (ref 135–145)
Sodium: 135 mmol/L (ref 135–145)
Sodium: 135 mmol/L (ref 135–145)

## 2016-02-08 LAB — CBC
HEMATOCRIT: 25.9 % — AB (ref 36.0–46.0)
HEMATOCRIT: 28.6 % — AB (ref 36.0–46.0)
HEMOGLOBIN: 8.3 g/dL — AB (ref 12.0–15.0)
HEMOGLOBIN: 9.3 g/dL — AB (ref 12.0–15.0)
MCH: 29.5 pg (ref 26.0–34.0)
MCH: 29.5 pg (ref 26.0–34.0)
MCHC: 32 g/dL (ref 30.0–36.0)
MCHC: 32.5 g/dL (ref 30.0–36.0)
MCV: 92.2 fL (ref 78.0–100.0)
MCV: 90.8 fL (ref 78.0–100.0)
Platelets: 119 10*3/uL — ABNORMAL LOW (ref 150–400)
Platelets: 112 10*3/uL — ABNORMAL LOW (ref 150–400)
RBC: 2.81 MIL/uL — AB (ref 3.87–5.11)
RBC: 3.15 MIL/uL — AB (ref 3.87–5.11)
RDW: 14.4 % (ref 11.5–15.5)
RDW: 16.1 % — ABNORMAL HIGH (ref 11.5–15.5)
WBC: 8.1 10*3/uL (ref 4.0–10.5)
WBC: 9.3 10*3/uL (ref 4.0–10.5)

## 2016-02-08 LAB — PREPARE RBC (CROSSMATCH)

## 2016-02-08 LAB — HEPATIC FUNCTION PANEL
ALBUMIN: 2.4 g/dL — AB (ref 3.5–5.0)
ALK PHOS: 34 U/L — AB (ref 38–126)
ALT: 96 U/L — AB (ref 14–54)
AST: 395 U/L — ABNORMAL HIGH (ref 15–41)
Bilirubin, Direct: 0.1 mg/dL (ref 0.1–0.5)
Indirect Bilirubin: 0.4 mg/dL (ref 0.3–0.9)
TOTAL PROTEIN: 4.1 g/dL — AB (ref 6.5–8.1)
Total Bilirubin: 0.5 mg/dL (ref 0.3–1.2)

## 2016-02-08 LAB — HEPARIN LEVEL (UNFRACTIONATED)
Heparin Unfractionated: 2.12 IU/mL — ABNORMAL HIGH (ref 0.30–0.70)
Heparin Unfractionated: <0.1 IU/mL — ABNORMAL LOW (ref 0.30–0.70)
Heparin Unfractionated: <0.1 IU/mL — ABNORMAL LOW (ref 0.30–0.70)

## 2016-02-08 LAB — TROPONIN I
TROPONIN I: 0.61 ng/mL — AB (ref <0.031)
Troponin I: 0.61 ng/mL (ref <0.031)

## 2016-02-08 LAB — MAGNESIUM: Magnesium: 2.1 mg/dL (ref 1.7–2.4)

## 2016-02-08 LAB — ABO/RH: ABO/RH(D): B POS

## 2016-02-08 LAB — SODIUM, URINE, RANDOM: SODIUM UR: 82 mmol/L

## 2016-02-08 MED ORDER — AMIODARONE HCL IN DEXTROSE 360-4.14 MG/200ML-% IV SOLN
60.0000 mg/h | INTRAVENOUS | Status: AC
  Administered 2016-02-08: 60 mg/h via INTRAVENOUS

## 2016-02-08 MED ORDER — SODIUM CHLORIDE 0.9 % IV BOLUS (SEPSIS)
500.0000 mL | Freq: Once | INTRAVENOUS | Status: AC
  Administered 2016-02-08: 500 mL via INTRAVENOUS

## 2016-02-08 MED ORDER — AMIODARONE HCL IN DEXTROSE 360-4.14 MG/200ML-% IV SOLN
30.0000 mg/h | INTRAVENOUS | Status: DC
  Administered 2016-02-08 – 2016-02-09 (×3): 30 mg/h via INTRAVENOUS
  Filled 2016-02-08 (×3): qty 200

## 2016-02-08 MED ORDER — METOPROLOL TARTRATE 5 MG/5ML IV SOLN: 5.0000 mg | Freq: Once | INTRAVENOUS | Status: DC

## 2016-02-08 MED ORDER — HEPARIN (PORCINE) IN NACL 100-0.45 UNIT/ML-% IJ SOLN
1050.0000 [IU]/h | INTRAMUSCULAR | Status: DC
  Administered 2016-02-08: 700 [IU]/h via INTRAVENOUS
  Filled 2016-02-08 (×2): qty 250

## 2016-02-08 MED ORDER — DEXTROSE 5 % IV SOLN: INTRAVENOUS | Status: DC

## 2016-02-08 MED ORDER — SODIUM CHLORIDE 0.9 % IV SOLN
Freq: Once | INTRAVENOUS | Status: AC
  Administered 2016-02-08: 04:00:00 via INTRAVENOUS

## 2016-02-08 MED ORDER — MAGNESIUM SULFATE IN D5W 1-5 GM/100ML-% IV SOLN
1.0000 g | Freq: Once | INTRAVENOUS | Status: AC
  Administered 2016-02-08: 1 g via INTRAVENOUS
  Filled 2016-02-08: qty 100

## 2016-02-08 MED ORDER — METOPROLOL TARTRATE 5 MG/5ML IV SOLN: 2.5000 mg | INTRAVENOUS | Status: DC | PRN

## 2016-02-08 MED ORDER — SODIUM ACETATE 2 MEQ/ML IV SOLN
INTRAVENOUS | Status: DC
  Administered 2016-02-08 – 2016-02-13 (×8): via INTRAVENOUS
  Filled 2016-02-08 (×19): qty 1000

## 2016-02-08 NOTE — Progress Notes (Signed)
ANTICOAGULATION CONSULT NOTE - Follow Up Consult  Pharmacy Consult for Heparin  Indication: atrial fibrillation, s/p right femoral embolectomy   Allergies  Allergen Reactions  . Codeine Itching and Nausea And Vomiting  . Vicodin [Hydrocodone-Acetaminophen] Itching and Nausea And Vomiting    Patient Measurements: Height: 5\' 2"  (157.5 cm) Weight: 192 lb 0.3 oz (87.1 kg) IBW/kg (Calculated) : 50.1  Vital Signs: Temp: 99.1 F (37.3 C) (04/27 1941) Temp Source: Oral (04/27 1941) BP: 86/47 mmHg (04/27 1900) Pulse Rate: 72 (04/27 1900)  Labs:  Recent Labs  01/24/2016 1236  02/07/16 1848 02/07/16 2059 02/08/16 0245 02/08/16 0250 02/08/16 1110 02/08/16 1826 02/08/16 2217  HGB 12.4  < >  --  7.6*  --  8.3* 9.3*  --   --   HCT 37.7  < >  --  23.1*  --  25.9* 28.6*  --   --   PLT 188  < >  --  113*  --  119* 112*  --   --   APTT  --   --  73*  --   --   --   --   --   --   LABPROT 15.3*  --  17.5*  --   --   --   --   --   --   INR 1.19  --  1.43  --   --   --   --   --   --   HEPARINUNFRC  --   < >  --  2.12*  --   --  <0.10*  --  <0.10*  CREATININE 2.28*  < >  --  2.93* 4.11*  --  4.54* 4.99*  --   TROPONINI 0.42*  < >  --  0.68*  --   --  0.61* 0.61*  --   < > = values in this interval not displayed.   Assessment: Heparin level is undetectable s/p re-start of heparin for afib/RLE embolus, Hgb up to 9.3, Scr is rising   Goal of Therapy:  Heparin level 0.3-0.7 units/ml Monitor platelets by anticoagulation protocol: Yes   Plan:  -Increase heparin to 850 units/hr -0800 HL  Abran DukeLedford, Ediel Unangst 02/08/2016,11:42 PM

## 2016-02-08 NOTE — Anesthesia Postprocedure Evaluation (Signed)
Anesthesia Post Note  Patient: Valerie Cordova  Procedure(s) Performed: Procedure(s) (LRB): RIGHT FEMORAL EMBOLECTOMY (Right) VEIN PATCH ANGIOPLASTY (Right) RIGHT LOWER LEG FASCIOTOMY  (Right) APPLICATION OF WOUND VAC (Right)  Patient location during evaluation: PACU Anesthesia Type: General Level of consciousness: awake and alert Pain management: pain level controlled Vital Signs Assessment: post-procedure vital signs reviewed and stable Respiratory status: spontaneous breathing, nonlabored ventilation, respiratory function stable and patient connected to nasal cannula oxygen Cardiovascular status: blood pressure returned to baseline and stable Postop Assessment: no signs of nausea or vomiting Anesthetic complications: no Comments: Diltiazem gtt decreased bc of HR in 80s and hypotension, albumin 250 given, weaning phenylephrine as tolerated with goals systolic > 90             Valerie Cordova, Valerie Cordova

## 2016-02-08 NOTE — Progress Notes (Addendum)
ICU Progress Note    CC:  S/p right femoral embolectomy and 4 compartment fasciotomy   CV:   HR  70's-110's AFib 70's-100's systolic  Gtts:   amiodarone  Cardizem off Heparin off  Echo 02/07/16: - Left ventricle: The cavity size was normal. Wall thickness was  normal. Systolic function was mildly to moderately reduced. The  estimated ejection fraction was in the range of 40% to 45%.  Diffuse hypokinesis. There is hypokinesis of the  mid-apicalanteroseptal myocardium. There is akinesis of the  midinferior myocardium. Features are consistent with a  pseudonormal left ventricular filling pattern, with concomitant  abnormal relaxation and increased filling pressure (grade 2  diastolic dysfunction). - Aortic valve: Trileaflet; moderately thickened, mildly calcified  leaflets. - Mitral valve: Calcified annulus. There was mild regurgitation. - Left atrium: The atrium was moderately dilated. - Right atrium: The atrium was mildly dilated. - Tricuspid valve: There was moderate regurgitation. - Pulmonary arteries: Systolic pressure was mildly increased. PA  peak pressure: 35 mm Hg (S).   Filed Vitals:   02/08/16 0745 02/08/16 0800  BP: 92/69 85/69  Pulse:  96  Temp:    Resp: 19 15    LUNGS: 96% 6LO2NC   non labored  ABG    Component Value Date/Time   PHART 7.397 02/07/2016 2226   PCO2ART 32.0* 02/07/2016 2226   PO2ART 79.9* 02/07/2016 2226   HCO3 19.3* 02/07/2016 2226   TCO2 20.3 02/07/2016 2226   ACIDBASEDEF 4.7* 02/07/2016 2226   O2SAT 95.7 02/07/2016 2226   chest X-ray  NEURO:   Pt will awake and somewhat answer questions-difficult to get her to wiggle her feet.  (pt does have hx of dementia as well).  RENAL:  Intake/Output Summary (Last 24 hours) at 02/08/16 0824 Last data filed at 02/08/16 0800  Gross per 24 hour  Intake 4008.23 ml  Output    475 ml  Net 3533.23 ml    BMET    Component Value Date/Time   NA 135 02/08/2016 0245   K 4.6  02/08/2016 0245   CL 105 02/08/2016 0245   CO2 18* 02/08/2016 0245   GLUCOSE 108* 02/08/2016 0245   BUN 25* 02/08/2016 0245   CREATININE 4.11* 02/08/2016 0245   CALCIUM 7.4* 02/08/2016 0245   GFRNONAA 9* 02/08/2016 0245   GFRAA 11* 02/08/2016 0245     HEME/ID:   Blood cx x 2 are in process Urine cx in process  JP drain 170cc/24hr Wound vac 150cc/24hr  CBC    Component Value Date/Time   WBC 8.1 02/08/2016 0250   RBC 2.81* 02/08/2016 0250   HGB 8.3* 02/08/2016 0250   HCT 25.9* 02/08/2016 0250   PLT 119* 02/08/2016 0250   MCV 92.2 02/08/2016 0250   MCH 29.5 02/08/2016 0250   MCHC 32.0 02/08/2016 0250   RDW 14.4 02/08/2016 0250    Blood Culture    Component Value Date/Time   SDES URINE, RANDOM 02/01/2016 2005   SPECREQUEST NONE 01/21/2016 2005   CULT 6,000 COLONIES/mL INSIGNIFICANT GROWTH* 02/05/2016 2005   REPTSTATUS 02/07/2016 FINAL 01/17/2016 2005    ABDOMEN: Soft, NT/ND (obese)  EXTREMITIES: +doppler signals right DP/peroneal; sensation decreased right foot. (decreased reaction to bottom of the foot stimulation compared to the left).  Motor difficult to assess as she will not wiggle her toes on either foot. Wound vac with good seal.  INCISIONS: Right groin is soft without hematoma; wound vac is in place.  Changed yesterday by RN who reports beefy red tissue.  CT abdomen/pelvis 02/08/16: IMPRESSION: 1. No retroperitoneal or other hematoma to explain dropping hematocrit. 2. Right groin catheter with extraluminal tip. 3. 27 mm right groin mass, favor pseudoaneurysm over hematoma. 4. Small pleural effusions and multi segment atelectasis. 5. Dilated gallbladder without superimposed inflammation.    A/P: 82 y.o. female who is s/p: 1. Right femoral embolectomy 2. Vein patch angioplasty right common femoral artery 3. Four compartment fasciotomy 4. Placement of negative pressure dressing on lateral fasciotomy site 2 Days Post-Op   -pt with +doppler  signals right foot -wound vac changes M/W/F -ARF on CRI with elevated creatinine today at 4.11 with decreased UOP-may need renal consult -pt with new onset AFib.  She did have wide complex tachycardia last evening with runs of NSVT.  She was started on amiodarone. -she did have soft BP yesterday and was given fluid bolus and one unit of blood last evening.  Cardizem gtt is off. -acute blood loss anemia-no retroperitoneal bleed on CT scan.  JP drain has not had a significant amount out to account for drop.  Stable this am after PRBC's.  Continue to monitor closely.   -keep drain in place today as there was 170cc/24 hr out. -appreciate cards/pulm/IM help with this pt. -hx UTI treated outpt with Bactrim/Fluroquinolone.  Now on vanc/zosyn.  vanc discontinued.   [name], PA-C [facility] [phone]  I have interviewed the patient and examined the patient. I agree with the findings by the PA.  Cari Carawayhris Dickson, MD [phone]

## 2016-02-08 NOTE — Progress Notes (Signed)
ANTICOAGULATION CONSULT NOTE   Pharmacy Consult for heparin Indication: new onset atrial fibrillation with acute embolus to right lower extremity  Allergies  Allergen Reactions  . Codeine Itching and Nausea And Vomiting  . Vicodin [Hydrocodone-Acetaminophen] Itching and Nausea And Vomiting    Patient Measurements: Height: 5\' 2"  (157.5 cm) Weight: 192 lb 0.3 oz (87.1 kg) IBW/kg (Calculated) : 50.1 Heparin Dosing Weight: ~69 kg  Vital Signs: Temp: 98.4 F (36.9 C) (04/27 1200) Temp Source: Oral (04/27 0800) BP: 86/58 mmHg (04/27 1200) Pulse Rate: 73 (04/27 1200)  Labs:  Recent Labs  04-02-2016 1236 02/07/16 0128 02/07/16 0926 02/07/16 1437 02/07/16 1848 02/07/16 2059 02/08/16 0245 02/08/16 0250 02/08/16 1110  HGB 12.4 11.5*  --   --   --  7.6*  --  8.3* 9.3*  HCT 37.7 34.5*  --   --   --  23.1*  --  25.9* 28.6*  PLT 188 153  --   --   --  113*  --  119* 112*  APTT  --   --   --   --  73*  --   --   --   --   LABPROT 15.3*  --   --   --  17.5*  --   --   --   --   INR 1.19  --   --   --  1.43  --   --   --   --   HEPARINUNFRC  --   --  0.90*  --   --  2.12*  --   --  <0.10*  CREATININE 2.28* 2.41*  --   --   --  2.93* 4.11*  --   --   TROPONINI 0.42*  --  0.99* 0.94*  --  0.68*  --   --  0.61*    Estimated Creatinine Clearance: 11 mL/min (by C-G formula based on Cr of 4.11).   Assessment: [age] yof with new onset atrial fibrillation with acute embolus to right lower extremity. Initially started on heparin on 4/25 that was stopped due R femoral embolectomy and 4 compartment fasciotomy. Pharmacy has been managing heparin for afib.  Heparin stopped 4/26 with Hg 11.5>>7.6. No source of bleed has been found and Hg is 9.3 today (s/p PRBC). Spoke with Valerie MassedSamantha Rhyne, PA and [name] and ok to resume heparin. -Heparin was last infusing at 700 units/hr (held and decreased due to a heparin level of 2.12)   Goal of Therapy:  Heparin level 0.3-0.7 units/ml Monitor platelets by  anticoagulation protocol: Yes   Plan:  -No heparin bolus -Begin heparin at 700 units/hr -Heparin level in 8 hours and daily wth CBC daily  Valerie Cordova, Pharm D 02/08/2016 12:50 PM

## 2016-02-08 NOTE — Progress Notes (Signed)
PULMONARY / CRITICAL CARE MEDICINE   Name: Valerie Cordova MRN: [id number] DOB: 1932/06/05    ADMISSION DATE:  2016-02-24 CONSULTATION DATE:  02/07/2016  REFERRING MD:  Dr. Susie Cassette  CHIEF COMPLAINT:  Oliguric, hypotensive  HISTORY OF PRESENT ILLNESS:   Ms. Valerie Cordova is a 82 y.o. woman with medical history as detailed below.  Admitted on 4/25.  She has history of dementia and unable to contribute to HPI.  Daughter is present in the room.  She has no significant cardiac history other than CHF per the chart, but came in with acute critical limb ischemia secondary to an acute right lower extremity arterial occlusion.  She was also found to be in new onset atrial fibrillation with RVR.  Went to OR on 4/25 for emergent right femoral artery embolectomy with Dr. Edilia Bo.  Her atrial fibrillation has been managed thus far by cardiology.  She is on heparin gtt and cardizem gtt, plus beta blocker PRN.  She also had a TTE on 4/26 with LVEF 40-45%, diffuse hypokinesis, grade 2 DD.    Since hospitalization, patient also has put out very little urine ( today) and her renal function has deteriorated with rising serum creatine.  She has also been having hematuria since yesterday.  Per review of her recent records from [place], creatine was 1.1 in February and 1.5 earlier this month.  She has history of recurrent UTI (currently being treated with Bactrim outpatient and recently finished course of fluoroquinolone) with E. Coli and Enterococcus on culture from [place].  She also has history of kidney stones and follows with a urologist.  PCCM has been consulted due to concern for hypotension and acute renal injury.   SUBJECTIVE:  Afebrile Poor UO Resting in bed, eyes closed, muttering Denies pain, easily aroused  VITAL SIGNS: BP 93/81 mmHg  Pulse 128  Temp(Src) 97 F (36.1 C) (Oral)  Resp 20  Ht  (1.575 m)  Wt 192 lb 0.3 oz (87.1 kg)  BMI 35.11 kg/m2  SpO2 94%  HEMODYNAMICS:    VENTILATOR  SETTINGS:    INTAKE / OUTPUT: I/O last 3 completed shifts: In: 4628.3 [I.V.:3893.3; Blood:335; IV Piggyback:400] Out: 1000 [Urine:540; Drains:460]  PHYSICAL EXAMINATION: General: resting in bed, not in distress, elderly appearing woman HEENT: [place]/AT, anicteric sclera Cardiac: irregular rhythm, regular rate, no rubs, murmurs or gallops Pulm: no increased work of breathing, moving normal volumes of air Abd: soft, nontender, nondistended Ext: warm and well perfused, no pedal edema, right leg wrapped in Ace bandage, rt groin drain Neuro: no focal deficits, alert, cooperative, following commands   LABS:  BMET  Recent Labs Lab 02/07/16 0128 02/07/16 2059 02/08/16 0245  NA 138 133* 135  K 5.4* 3.7 4.6  CL 107 98* 105  CO2 18* 23 18*  BUN 15 17 25*  CREATININE 2.41* 2.93* 4.11*  GLUCOSE 124* 98 108*    Electrolytes  Recent Labs Lab 02-24-2016 1236 02/07/16 0128 02/07/16 2059 02/08/16 0245  CALCIUM 8.7* 7.6* 5.6* 7.4*  MG 1.7  --  1.2* 2.1    CBC  Recent Labs Lab 02/07/16 0128 02/07/16 2059 02/08/16 0250  WBC 8.7 7.9 8.1  HGB 11.5* 7.6* 8.3*  HCT 34.5* 23.1* 25.9*  PLT 153 113* 119*    Coag's  Recent Labs Lab 2016/02/24 1236 02/07/16 1848  APTT  --  73*  INR 1.19 1.43    Sepsis Markers  Recent Labs Lab 02/07/16 1848 02/07/16 2059  LATICACIDVEN 1.8 1.7  PROCALCITON 1.18  --  ABG  Recent Labs Lab 02/07/16 2226  PHART 7.397  PCO2ART 32.0*  PO2ART 79.9*    Liver Enzymes  Recent Labs Lab 02/07/16 2059  AST 395*  ALT 96*  ALKPHOS 34*  BILITOT 0.5  ALBUMIN 2.4*    Cardiac Enzymes  Recent Labs Lab 02/07/16 0926 02/07/16 1437 02/07/16 2059  TROPONINI 0.99* 0.94* 0.68*    Glucose  Recent Labs Lab 02/10/2016 1750  GLUCAP 194*    Imaging Ct Abdomen Pelvis Wo Contrast  02/08/2016  CLINICAL DATA:  Abdominal pain EXAM: CT ABDOMEN AND PELVIS WITHOUT CONTRAST TECHNIQUE: Multidetector CT imaging of the abdomen and pelvis was  performed following the standard protocol without IV contrast. COMPARISON:  None. FINDINGS: Lower chest and abdominal wall: Small pleural effusions, greater on the right, with multi segment atelectasis. Small sliding hiatal hernia. Hepatobiliary: Mild hepatic steatosis. No focal finding.Dilated gallbladder without calcified stone or inflammation. Pancreas: No acute finding Spleen: Unremarkable. Adrenals/Urinary Tract: Negative adrenals. Extensive bilateral nephrolithiasis, largest stone or stone conglomerate in the upper pole right kidney measuring 23 mm. Notable 3 mm stone in the right renal pelvis. No hydronephrosis. Right worse than left renal cortical atrophy/thinning. Decompressed bladder by Foley catheter Reproductive:No acute finding. Stomach/Bowel: No obstruction. Distal colonic diverticulosis. No inflammatory changes. Vascular/Lymphatic: There is a central line in the right groin with extra luminal tip. Mild stranding in the right groin and upper thigh attributed to vascular access. A 27 mm (craniocaudal) smooth mass medial to the common femoral artery bifurcation, primarily suspicious for pseudoaneurysm. No discrete hematoma in the groin or abdominal wall or retroperitoneum. Peritoneal: No ascites or pneumoperitoneum. Musculoskeletal: No acute abnormalities. IMPRESSION: 1. No retroperitoneal or other hematoma to explain dropping hematocrit. 2. Right groin catheter with extraluminal tip. 3. 27 mm right groin mass, favor pseudoaneurysm over hematoma. 4. Small pleural effusions and multi segment atelectasis. 5. Dilated gallbladder without superimposed inflammation. Electronically Signed   By: Marnee Spring M.D.   On: 02/08/2016 03:02   US Renal Port  02/07/2016  CLINICAL DATA:  Anuria. EXAM: RENAL / URINARY TRACT ULTRASOUND COMPLETE COMPARISON:  None. FINDINGS: Right Kidney: Length: 9 cm. Smooth atrophy favoring chronic arterial compromise. No hydronephrosis. Left Kidney: Length: 12 cm. Echogenicity  within normal limits. No mass or hydronephrosis visualized. Bladder: Foley catheter reportedly present with bladder not visualized. IMPRESSION: 1. No hydronephrosis.  Decompressed bladder. 2. Global right renal atrophy. Electronically Signed   By: Marnee Spring M.D.   On: 02/07/2016 22:51   Dg Chest Port 1 View  02/07/2016  CLINICAL DATA:  Sepsis. EXAM: PORTABLE CHEST 1 VIEW COMPARISON:  February 06, 2016. FINDINGS: Stable cardiomegaly. No pneumothorax or pleural effusion is noted. No acute pulmonary disease is noted. Status post kyphoplasty of mid thoracic vertebral body. Old left rib fractures are noted. IMPRESSION: No acute cardiopulmonary abnormality seen. Electronically Signed   By: Lupita Raider, M.D.   On: 02/07/2016 18:26     STUDIES:  4/25 CXR > Cardiomegaly with pulmonary vascular congestion and likely mild interstitial edema 4/26 Renal ultrasound > rt renal atrophy 4/26 CT abdomen w/o contrast > no RP bleed, rt femoral pseudoaneurysm 27 mm Echo -EF 40%, WMA +, gr 2 DD  CULTURES: 4/26 Blood cx > 4/25 Urine cx > 6,000 colonies/mL insignificant growth   ANTIBIOTICS: Vancomycin 4/26 > Zosyn 4/26 > TMP-SMX 4/25 > 4/25  SIGNIFICANT EVENTS: 4/25 Admit with critical limb ischemia, taken to OR with Dr. Edilia Bo.  Also found to have atrial fibrillation with RVR so cardiology was  consulted 4/26 [facility] consulted.  Worsening renal function and some hypotension prompting PCCM consult   LINES/TUBES: Foley 4/25 PIV x 3 4/25 Wound Vac 4/25  DISCUSSION: 82 y/o female with dementia but no prior cardiac history admitted for acute critical limb ischemia secondary to acute right LE arterial occlusion. Found to be in new onset atrial fibrillation. She was taken to OR emergently by vascular surgery for right femoral embolectomy per Dr. Edilia Boickson On 4/25. Cardiology consulted for new onset atrial fibrillation. Hospitalist consulted for medical management. Patient found to have systolic blood  pressures in the 80s over the last 24 hours with oliguric renal failure  ASSESSMENT / PLAN:  PULMONARY A: Acute hypoxic resp failure due to pulmonary edema P:   - Supplemental oxygen as needed  CARDIOVASCULAR A:  New Onset Atrial Fibrillation w/ RVR - management per cardiology Hypotension Elevated troponin Critical Limb Ischemia s/p right femoral embolectomy Non-sustained V-tach, WCT - on amio gtt Acute CHF - likely more diastolic.  Echo results as above P:  - Amio  gtt and heparin gtt per cardiology - watch fluid balance   RENAL A:   AKI - unclear etiology at this point.  Diff Dx includes: ATN from hypotension, obstruction, medication (TMP-SMX prior to admit).   Hypocalcemia Hypomagnesium AG Metabolic Acidosis - better P:   - continue to follow BMET - correct electrolytes - follow UOP, I/O - likely will need renal consult  GASTROINTESTINAL A:   No acute issues P:   - clear liquid diet - PPI  HEMATOLOGIC A:   DVT PPx Acute blood loss anemia - Hb drop from 12 to 8, no RP bleed P:  - heparin gtt on hold due to Hb drop  INFECTIOUS A:   ? Urinary tract infection with ? Sepsis physiology P:   - she has a normal WBC, afebrile, normal lactic acid, and has been treated outpatient for UTI with Bactrim and Fluoroquinolone - recent urine culture looks okay - vanc and zosyn added by [facility], dc vanc - follow CBC, fevers  ENDOCRINE A:   Hypothyroidism   P:   - continue synthroid  NEUROLOGIC A:   Hx of Alzheimers Hx of Depression P:   - Aricept QHS - Celexa daily - RASS goal: 0 - Code status discussion actively ongoing with family    FAMILY  - Updates:daughter at bedside  - Inter-disciplinary family meet or Palliative Care meeting due by:  day 7  The patient is critically ill with multiple organ systems failure and requires high complexity decision making for assessment and support, frequent evaluation and titration of therapies, application of  advanced monitoring technologies and extensive interpretation of multiple databases. Critical Care Time devoted to patient care services described in this note independent of APP time is 31 minutes.   Cyril Mourningakesh Ean Gettel MD. Tonny BollmanFCCP. [facility] Pager [phone] If no response call [phone]   02/08/2016     02/08/2016, 8:52 AM

## 2016-02-08 NOTE — Progress Notes (Signed)
PCCM INTERVAL PROGRESS NOTE  Called to bedside by staff RN to evaluate patient in setting wide complex tachycardia with rate 150s. She has been having runs of NSVT off and on tonight, previously the longest was about 20 seconds worth, however this time rhythm sustained much longer. Earlier this PM she had some electrolyte abnormalities that were corrected, but WCT persists despite that. SBP dropped into 80s during this episode, which fortunately was spontaneously abated, however, she converted into this wide complex tachycardia again for another short period of time. Amiodarone infusion initiated. Will give another gram of mag and await type/screen, and transfusion. CT abdomen pending to rule out RP bleed. Currently NSR on monitor.   Joneen RoachPaul Jennyfer Nickolson, AGACNP-BC [facility] Pager [phone] or [phone]  02/08/2016 1:25 AM

## 2016-02-08 NOTE — Progress Notes (Signed)
SUBJECTIVE:  Had some nausea this am  OBJECTIVE:   Vitals:   Filed Vitals:   02/08/16 0845 02/08/16 0900 02/08/16 0915 02/08/16 0930  BP: 102/71 94/72 94/83  89/51  Pulse:    79  Temp:      TempSrc:      Resp: 20 19 16 19   Height:      Weight:      SpO2:    100%   I&O's:   Intake/Output Summary (Last 24 hours) at 02/08/16 0958 Last data filed at 02/08/16 0915  Gross per 24 hour  Intake 4104.31 ml  Output    485 ml  Net 3619.31 ml   TELEMETRY: Reviewed telemetry pt in NSR:     PHYSICAL EXAM General: Well developed, well nourished, in no acute distress Head: Eyes PERRLA, No xanthomas.   Normal cephalic and atramatic  Lungs:   Clear bilaterally to auscultation and percussion. Heart:   HRRR S1 S2 Pulses are 2+ & equal. Abdomen: Bowel sounds are positive, abdomen soft and non-tender without masses  Extremities:   No clubbing, cyanosis or edema.  DP +1 Neuro: Alert and oriented X 3. Psych:  Good affect, responds appropriately   LABS: Basic Metabolic Panel:  Recent Labs  16/07/9603/26/17 2059 02/08/16 0245  NA 133* 135  K 3.7 4.6  CL 98* 105  CO2 23 18*  GLUCOSE 98 108*  BUN 17 25*  CREATININE 2.93* 4.11*  CALCIUM 5.6* 7.4*  MG 1.2* 2.1   Liver Function Tests:  Recent Labs  02/07/16 2059  AST 395*  ALT 96*  ALKPHOS 34*  BILITOT 0.5  PROT 4.1*  ALBUMIN 2.4*   No results for input(s): LIPASE, AMYLASE in the last 72 hours. CBC:  Recent Labs  02/07/16 2059 02/08/16 0250  WBC 7.9 8.1  HGB 7.6* 8.3*  HCT 23.1* 25.9*  MCV 93.1 92.2  PLT 113* 119*   Cardiac Enzymes:  Recent Labs  02/07/16 0926 02/07/16 1437 02/07/16 2059  TROPONINI 0.99* 0.94* 0.68*   BNP: Invalid input(s): POCBNP D-Dimer: No results for input(s): DDIMER in the last 72 hours. Hemoglobin A1C: No results for input(s): HGBA1C in the last 72 hours. Fasting Lipid Panel: No results for input(s): CHOL, HDL, LDLCALC, TRIG, CHOLHDL, LDLDIRECT in the last 72 hours. Thyroid Function  Tests:  Recent Labs  08/21/16 1236  TSH 0.377   Anemia Panel: No results for input(s): VITAMINB12, FOLATE, FERRITIN, TIBC, IRON, RETICCTPCT in the last 72 hours. Coag Panel:   Lab Results  Component Value Date   INR 1.43 02/07/2016   INR 1.19 12/11/2015    RADIOLOGY: Ct Abdomen Pelvis Wo Contrast  02/08/2016  CLINICAL DATA:  Abdominal pain EXAM: CT ABDOMEN AND PELVIS WITHOUT CONTRAST TECHNIQUE: Multidetector CT imaging of the abdomen and pelvis was performed following the standard protocol without IV contrast. COMPARISON:  None. FINDINGS: Lower chest and abdominal wall: Small pleural effusions, greater on the right, with multi segment atelectasis. Small sliding hiatal hernia. Hepatobiliary: Mild hepatic steatosis. No focal finding.Dilated gallbladder without calcified stone or inflammation. Pancreas: No acute finding Spleen: Unremarkable. Adrenals/Urinary Tract: Negative adrenals. Extensive bilateral nephrolithiasis, largest stone or stone conglomerate in the upper pole right kidney measuring 23 mm. Notable 3 mm stone in the right renal pelvis. No hydronephrosis. Right worse than left renal cortical atrophy/thinning. Decompressed bladder by Foley catheter Reproductive:No acute finding. Stomach/Bowel: No obstruction. Distal colonic diverticulosis. No inflammatory changes. Vascular/Lymphatic: There is a central line in the right groin with extra luminal tip. Mild stranding  in the right groin and upper thigh attributed to vascular access. A 27 mm (craniocaudal) smooth mass medial to the common femoral artery bifurcation, primarily suspicious for pseudoaneurysm. No discrete hematoma in the groin or abdominal wall or retroperitoneum. Peritoneal: No ascites or pneumoperitoneum. Musculoskeletal: No acute abnormalities. IMPRESSION: 1. No retroperitoneal or other hematoma to explain dropping hematocrit. 2. Right groin catheter with extraluminal tip. 3. 27 mm right groin mass, favor pseudoaneurysm over  hematoma. 4. Small pleural effusions and multi segment atelectasis. 5. Dilated gallbladder without superimposed inflammation. Electronically Signed   By: Marnee Spring M.D.   On: 02/08/2016 03:02   US Renal Port  02/07/2016  CLINICAL DATA:  Anuria. EXAM: RENAL / URINARY TRACT ULTRASOUND COMPLETE COMPARISON:  None. FINDINGS: Right Kidney: Length: 9 cm. Smooth atrophy favoring chronic arterial compromise. No hydronephrosis. Left Kidney: Length: 12 cm. Echogenicity within normal limits. No mass or hydronephrosis visualized. Bladder: Foley catheter reportedly present with bladder not visualized. IMPRESSION: 1. No hydronephrosis.  Decompressed bladder. 2. Global right renal atrophy. Electronically Signed   By: Marnee Spring M.D.   On: 02/07/2016 22:51   Dg Chest Port 1 View  02/07/2016  CLINICAL DATA:  Sepsis. EXAM: PORTABLE CHEST 1 VIEW COMPARISON:  Feb 11, 2016. FINDINGS: Stable cardiomegaly. No pneumothorax or pleural effusion is noted. No acute pulmonary disease is noted. Status post kyphoplasty of mid thoracic vertebral body. Old left rib fractures are noted. IMPRESSION: No acute cardiopulmonary abnormality seen. Electronically Signed   By: Lupita Raider, M.D.   On: 02/07/2016 18:26   Dg Chest Port 1 View  02/11/2016  CLINICAL DATA:  Chest pain. Cold, painful right leg. Atrial fibrillation. History of CHF. EXAM: PORTABLE CHEST 1 VIEW COMPARISON:  None. FINDINGS: The cardiac silhouette is mildly enlarged. Calcification and tortuosity are noted of the thoracic aorta. There is pulmonary vascular congestion with mild diffuse interstitial prominence. No sizable pleural effusion or pneumothorax is identified. Bilateral rib fractures are noted and appear old. A mid thoracic vertebral compression fracture is noted status post cement augmentation. IMPRESSION: Cardiomegaly with pulmonary vascular congestion and likely mild interstitial edema. Electronically Signed   By: Sebastian Ache M.D.   On: 2016/02/11  13:03   ASSESSMENT AND PLAN  Principal Problem:  Critical lower limb ischemia Active Problems:  Atrial fibrillation with RVR (HCC)  Ischemia of lower extremity   1. Acute Critical Limb Ischemia: in the setting of new onset atrial fibrillation w/ RVR, thus presumably from cardio embolic source. She is s/p emergent right femoral artery embolectomy per Dr. Edilia Bo. Continue post op management per vascular surgery.   2. New Onset Atrial Fibrillation w/ RVR: Converted to NSR shortly after her surgery and was in NSR 4/26.  IV Cardizem stopped 4/26 in the evening due to low BP.  She went back into afib with RVR and SVT and ultimately converted to NSR on IV Amio gtt.  Based on known risk factors, her calculated CHA2DS2 VASc score is 5 for Age >2, embolic event and female sex. Given embolic event, she is at high risk for recurrence and high risk for stroke. She will require anticoagulation, once cleared to start by vascular surgery. Continue IV heparin for now. Her admit SCr is 2.28 and now 4.11 We have no baseline labs for comparison.Based on renal function currently, she is not a candidate for NOAC. Would have to use Coumadin with heparin bridge.  2D echoshowed EF 40-45% with mid to apical anteroseptal HK and AK of the mid inferior wall  and grade 2 DD, moderately dilated LA and mild pulmonary HTN with PASP .  Continue IV Amio gtt for now.   2. Abnormal Troponin: peak trop 0.99 with flat trend. This may be secondary to demand ischemia in the setting of rapid Afib and renal insufficiency.  Given mild LV dysfunction with segmental wall motion abnormalities, will require further evaluation to r/o underlying coronary ischemia once she recovers from acute illness. However may be limited to perform cath given renal function. There are no baseline EKGs for comparison. She is currently on IV heparin for afib.   3. Renal Insufficiency: ? AKI vs chronic renal insuffiencey. There are no baseline labs for  comparison. BMET this am shows increasing creatinine. She does have evidence of nephrolithiasis on CT and hematuria.  ? Whether she could have had an embolic event to her kidney  4. Acute combined systolic/diastolic CHF - Most likely exacerbated by acute renal insuff and afib with RVR. BNP mildly elevated and chest xray  showed pulmonary vascular congestion with mild interstitial edema. Chest CT showed small pleural effusions with atelectasis.  2D echo with EF 40-45%. She got 1 dose of IV Lasix. Will hold on further diuretics for now given increased creatinine. Follow trend of renal function. Will try to get old records to determine what baseline creatinine is.      Quintella Reichert, MD  02/08/2016  9:58 AM

## 2016-02-08 NOTE — Progress Notes (Signed)
While preparing for transport to CT pt went into SVT with a rate of 150 sustained. CCM called to bedside and a 12 lead ecg obtained. ECG showed wide complex tachycardia with fusion complex's. CCM at bedside and present via camera. Verbal orders received to start amiodarone gtt. Will continue with planned transport to ct and continue to closely monitor. Family present in room and plans to stay the night. Will continue to closely monitor. Modena JanskyKevin Quanta Robertshaw RN [facility]

## 2016-02-08 NOTE — Progress Notes (Signed)
eLink Physician-Brief Progress Note Patient Name: Dorthula NettlesBetty Ellerson DOB: 05-25-1932 MRN: [id number]   Date of Service  02/08/2016  HPI/Events of Note  WCT @ 150/min with stable BP. Suspect 2;1 flutter vs PSVT. Broke spontaneously, then back into it. 12 lead EKG reveals regular wide complex rhythm most c/w PSVT  eICU Interventions  Metoprolol 5 mg IV X 1, then 2.5 - 5 mg IV q 3 hrs PRN. Already has Cardiology involved as she was in new onset AF when admitted     Intervention Category Major Interventions: Arrhythmia - evaluation and management  Billy FischerDavid Simonds 02/08/2016, 1:19 AM

## 2016-02-08 NOTE — Progress Notes (Signed)
Results of CT called to [facility]. Orders received to continue to hold heparin gtt and let primary team reassess in A.M. No further orders at this time, will continue to closely monitor.\ Modena JanskyKevin Kelii Chittum RN 2 [facility]

## 2016-02-08 NOTE — Progress Notes (Signed)
[facility] being [facility] Will [facility], PCCM  following Notified Dr Myra GianottiBrabham  And Dr Edilia Boickson

## 2016-02-09 ENCOUNTER — Inpatient Hospital Stay (HOSPITAL_COMMUNITY): Payer: Medicare (Managed Care)

## 2016-02-09 DIAGNOSIS — N179 Acute kidney failure, unspecified: Secondary | ICD-10-CM

## 2016-02-09 LAB — HEMOGLOBIN AND HEMATOCRIT, BLOOD
HCT: 26.2 % — ABNORMAL LOW (ref 36.0–46.0)
Hemoglobin: 8.6 g/dL — ABNORMAL LOW (ref 12.0–15.0)

## 2016-02-09 LAB — SODIUM, URINE, RANDOM: SODIUM UR: 77 mmol/L

## 2016-02-09 LAB — HEPATIC FUNCTION PANEL
ALT: 97 U/L — AB (ref 14–54)
AST: 340 U/L — ABNORMAL HIGH (ref 15–41)
Albumin: 2.5 g/dL — ABNORMAL LOW (ref 3.5–5.0)
Alkaline Phosphatase: 40 U/L (ref 38–126)
BILIRUBIN DIRECT: 0.5 mg/dL (ref 0.1–0.5)
BILIRUBIN INDIRECT: 0.9 mg/dL (ref 0.3–0.9)
TOTAL PROTEIN: 4.6 g/dL — AB (ref 6.5–8.1)
Total Bilirubin: 1.4 mg/dL — ABNORMAL HIGH (ref 0.3–1.2)

## 2016-02-09 LAB — URINE CULTURE: CULTURE: NO GROWTH

## 2016-02-09 LAB — TROPONIN I
TROPONIN I: 0.36 ng/mL — AB (ref <0.031)
TROPONIN I: 0.42 ng/mL — AB (ref <0.031)
TROPONIN I: 0.46 ng/mL — AB (ref <0.031)
TROPONIN I: 0.55 ng/mL — AB (ref <0.031)

## 2016-02-09 LAB — BASIC METABOLIC PANEL
ANION GAP: 16 — AB (ref 5–15)
BUN: 33 mg/dL — AB (ref 6–20)
CHLORIDE: 99 mmol/L — AB (ref 101–111)
CO2: 19 mmol/L — ABNORMAL LOW (ref 22–32)
Calcium: 7.6 mg/dL — ABNORMAL LOW (ref 8.9–10.3)
Creatinine, Ser: 5.66 mg/dL — ABNORMAL HIGH (ref 0.44–1.00)
GFR calc Af Amer: 7 mL/min — ABNORMAL LOW (ref >60)
GFR, EST NON AFRICAN AMERICAN: 6 mL/min — AB (ref >60)
Glucose, Bld: 108 mg/dL — ABNORMAL HIGH (ref 65–99)
POTASSIUM: 4.4 mmol/L (ref 3.5–5.1)
SODIUM: 134 mmol/L — AB (ref 135–145)

## 2016-02-09 LAB — CBC
HCT: 26.8 % — ABNORMAL LOW (ref 36.0–46.0)
HEMOGLOBIN: 8.9 g/dL — AB (ref 12.0–15.0)
MCH: 30.2 pg (ref 26.0–34.0)
MCHC: 33.2 g/dL (ref 30.0–36.0)
MCV: 90.8 fL (ref 78.0–100.0)
Platelets: 100 10*3/uL — ABNORMAL LOW (ref 150–400)
RBC: 2.95 MIL/uL — AB (ref 3.87–5.11)
RDW: 16.4 % — ABNORMAL HIGH (ref 11.5–15.5)
WBC: 7.2 10*3/uL (ref 4.0–10.5)

## 2016-02-09 LAB — APTT: APTT: 58 s — AB (ref 24–37)

## 2016-02-09 LAB — FIBRINOGEN: Fibrinogen: 482 mg/dL — ABNORMAL HIGH (ref 204–475)

## 2016-02-09 LAB — HEPARIN LEVEL (UNFRACTIONATED)

## 2016-02-09 LAB — PROTIME-INR
INR: 1.27 (ref 0.00–1.49)
Prothrombin Time: 16 seconds — ABNORMAL HIGH (ref 11.6–15.2)

## 2016-02-09 LAB — CREATININE, URINE, RANDOM: Creatinine, Urine: 76.41 mg/dL

## 2016-02-09 LAB — PLATELET COUNT: Platelets: 95 10*3/uL — ABNORMAL LOW (ref 150–400)

## 2016-02-09 LAB — CK: CK TOTAL: 20406 U/L — AB (ref 38–234)

## 2016-02-09 LAB — LACTATE DEHYDROGENASE: LDH: 786 U/L — AB (ref 98–192)

## 2016-02-09 MED ORDER — MIDAZOLAM HCL 2 MG/2ML IJ SOLN
INTRAMUSCULAR | Status: AC
  Administered 2016-02-09: 0.5 mg via INTRAVENOUS
  Filled 2016-02-09: qty 2

## 2016-02-09 MED ORDER — MIDAZOLAM HCL 2 MG/2ML IJ SOLN
0.5000 mg | Freq: Once | INTRAMUSCULAR | Status: AC
  Administered 2016-02-09: 0.5 mg via INTRAVENOUS
  Filled 2016-02-09: qty 2

## 2016-02-09 MED ORDER — ATROPINE SULFATE 1 MG/10ML IJ SOSY
PREFILLED_SYRINGE | INTRAMUSCULAR | Status: AC
  Filled 2016-02-09: qty 10

## 2016-02-09 MED ORDER — LIDOCAINE HCL (PF) 2 % IJ SOLN: 10.0000 mL | Freq: Once | INTRAMUSCULAR | Status: DC

## 2016-02-09 MED FILL — Medication: Qty: 1 | Status: AC

## 2016-02-09 NOTE — Progress Notes (Signed)
ANTICOAGULATION CONSULT NOTE   Pharmacy Consult for heparin Indication: new onset atrial fibrillation with acute embolus to right lower extremity  Allergies  Allergen Reactions  . Codeine Itching and Nausea And Vomiting  . Vicodin [Hydrocodone-Acetaminophen] Itching and Nausea And Vomiting    Patient Measurements: Height: 5\' 2"  (157.5 cm) Weight: 192 lb 0.3 oz (87.1 kg) IBW/kg (Calculated) : 50.1 Heparin Dosing Weight: ~69 kg  Vital Signs: Temp: 98.3 F (36.8 C) (04/28 0831) Temp Source: Axillary (04/28 0831) BP: 92/60 mmHg (04/28 0900) Pulse Rate: 70 (04/28 0900)  Labs:  Recent Labs  May 23, 2016 1236  02/07/16 1848  02/08/16 0250 02/08/16 1110 02/08/16 1826 02/08/16 2217 02/08/16 2336 02/09/16 0618  HGB 12.4  < >  --   < > 8.3* 9.3*  --   --   --  8.9*  HCT 37.7  < >  --   < > 25.9* 28.6*  --   --   --  26.8*  PLT 188  < >  --   < > 119* 112*  --   --   --  100*  APTT  --   --  73*  --   --   --   --   --   --   --   LABPROT 15.3*  --  17.5*  --   --   --   --   --   --   --   INR 1.19  --  1.43  --   --   --   --   --   --   --   HEPARINUNFRC  --   < >  --   < >  --  <0.10*  --  <0.10*  --  <0.10*  CREATININE 2.28*  < >  --   < >  --  4.54* 4.99*  --   --  5.66*  TROPONINI 0.42*  < >  --   < >  --  0.61* 0.61*  --  0.55* 0.46*  < > = values in this interval not displayed.  Estimated Creatinine Clearance: 8 mL/min (by C-G formula based on Cr of 5.66).   Assessment: [age] yof with new onset atrial fibrillation with acute embolus to right lower extremity. Initially started on heparin on 4/25 that was stopped due R femoral embolectomy and 4 compartment fasciotomy. Pharmacy has been managing heparin for afib. Plans noted for coumadin but holding for now in case HD access needed. -heparin level is still undetecable -hg= 8.9, plt= 100 (noted 113 on 4/26)   Goal of Therapy:  Heparin level 0.3-0.7 units/ml Monitor platelets by anticoagulation protocol: Yes   Plan:   -Change heparin to 1050 units/hr -Heparin level in 8 hours and daily wth CBC daily  Harland Germanndrew Adeleine Pask, Pharm D 02/09/2016 9:46 AM

## 2016-02-09 NOTE — Progress Notes (Signed)
PT Cancellation Note  Patient Details Name: Valerie NettlesBetty Cordova MRN: [id number] DOB: 24-Sep-1932   Cancelled Treatment:    Reason Eval/Treat Not Completed: Medical issues which prohibited therapy (Pt with worsening kidney function). Will follow up Monday.   Sean Macwilliams 02/09/2016, 1:45 PM [employer] CorporationCary Syretta Kochel PT [phone]

## 2016-02-09 NOTE — Procedures (Signed)
CENTRAL VENOUS CATHETER INSERTION   Indication: Hemodialysis Consent: yes Time out: yes Appropriate position: yes Hand washing: yes Patient Sterilized and Draped: yes Location: Right IJ # of Attempts: 1 Ultrasound Guidance: yes Wire Confirmed with US: yes Insertion depth: 15 cm All Ports Draw and flush: yes CXR:   Pneumothorax: no  Line position appropriate: yes Line sutured in place: yes EBL: 5 cc Complications: no Patient Tolerated Procedure Well: yes     Galvin Profferaniel Wray Goehring, DO., MS [facility]

## 2016-02-09 NOTE — Progress Notes (Signed)
*  PRELIMINARY RESULTS* Vascular Ultrasound Renal Artery Duplex has been completed.   Study was very technically difficult due to poor patient cooperation, patient body habitus, and overlying bowel gas. Unable to adequately evaluate bilateral renal arteries due to technical limitations, therefore this study is essentially inconclusive.  Incidental findings: There is a hyperechoic area of the upper pole of the right kidney measuring 2.3cm with posterior acoustic shadowing; suggestive of nephrolithiasis. There are multiple anechoic areas of the right kidney, largest measuring 1.9cm, suggestive of possible simple cysts. The gallbladder is prominent, measuring 12.5cm in length; etiology unknown.  02/09/2016 3:43 PM Gertie FeyMichelle Chery Giusto, RVT, RDCS, RDMS

## 2016-02-09 NOTE — Progress Notes (Addendum)
SUBJECTIVE:  No complaints  OBJECTIVE:   Vitals:   Filed Vitals:   02/09/16 0730 02/09/16 0800 02/09/16 0831 02/09/16 0900  BP: 93/63 89/53  92/60  Pulse: 73 70  70  Temp:   98.3 F (36.8 C)   TempSrc:   Axillary   Resp: Height:      Weight:      SpO2: 92% 98%  100%   I&O's:   Intake/Output Summary (Last 24 hours) at 02/09/16 5366 Last data filed at 02/09/16 0700  Gross per 24 hour  Intake 2056.4 ml  Output    222 ml  Net 1834.4 ml   TELEMETRY: Reviewed telemetry pt in sinus bradycardia with PACs     PHYSICAL EXAM General: Well developed, well nourished, in no acute distress Head: Eyes PERRLA, No xanthomas.   Normal cephalic and atramatic  Lungs:   Clear bilaterally to auscultation and percussion. Heart:   HRRR S1 S2 Pulses are 2+ & equal. Abdomen: Bowel sounds are positive, abdomen soft and non-tender without masses Extremities:   No clubbing, cyanosis or edema.  DP +1 Neuro: Alert and oriented X 2.    LABS: Basic Metabolic Panel:  Recent Labs  44/03/47 2059 02/08/16 0245  02/08/16 1826 02/09/16 0618  NA 133* 135  < > 134* 134*  K 3.7 4.6  < > 4.8 4.4  CL 98* 105  < > 102 99*  CO2 23 18*  < > 18* 19*  GLUCOSE 98 108*  < > 115* 108*  BUN 17 25*  < > 29* 33*  CREATININE 2.93* 4.11*  < > 4.99* 5.66*  CALCIUM 5.6* 7.4*  < > 7.5* 7.6*  MG 1.2* 2.1  --   --   --   < > = values in this interval not displayed. Liver Function Tests:  Recent Labs  02/07/16 2059  AST 395*  ALT 96*  ALKPHOS 34*  BILITOT 0.5  PROT 4.1*  ALBUMIN 2.4*   No results for input(s): LIPASE, AMYLASE in the last 72 hours. CBC:  Recent Labs  02/08/16 1110 02/09/16 0618  WBC 9.3 7.2  HGB 9.3* 8.9*  HCT 28.6* 26.8*  MCV 90.8 90.8  PLT 112* 100*   Cardiac Enzymes:  Recent Labs  02/08/16 1826 02/08/16 2336 02/09/16 0618  TROPONINI 0.61* 0.55* 0.46*   BNP: Invalid input(s): POCBNP D-Dimer: No results for input(s): DDIMER in the last 72  hours. Hemoglobin A1C: No results for input(s): HGBA1C in the last 72 hours. Fasting Lipid Panel: No results for input(s): CHOL, HDL, LDLCALC, TRIG, CHOLHDL, LDLDIRECT in the last 72 hours. Thyroid Function Tests:  Recent Labs  01/18/2016 1236  TSH 0.377   Anemia Panel: No results for input(s): VITAMINB12, FOLATE, FERRITIN, TIBC, IRON, RETICCTPCT in the last 72 hours. Coag Panel:   Lab Results  Component Value Date   INR 1.43 02/07/2016   INR 1.19 01/18/2016    RADIOLOGY: Ct Abdomen Pelvis Wo Contrast  02/08/2016  CLINICAL DATA:  Abdominal pain EXAM: CT ABDOMEN AND PELVIS WITHOUT CONTRAST TECHNIQUE: Multidetector CT imaging of the abdomen and pelvis was performed following the standard protocol without IV contrast. COMPARISON:  None. FINDINGS: Lower chest and abdominal wall: Small pleural effusions, greater on the right, with multi segment atelectasis. Small sliding hiatal hernia. Hepatobiliary: Mild hepatic steatosis. No focal finding.Dilated gallbladder without calcified stone or inflammation. Pancreas: No acute finding Spleen: Unremarkable. Adrenals/Urinary Tract: Negative adrenals. Extensive bilateral nephrolithiasis, largest stone or stone conglomerate in  the upper pole right kidney measuring 23 mm. Notable 3 mm stone in the right renal pelvis. No hydronephrosis. Right worse than left renal cortical atrophy/thinning. Decompressed bladder by Foley catheter Reproductive:No acute finding. Stomach/Bowel: No obstruction. Distal colonic diverticulosis. No inflammatory changes. Vascular/Lymphatic: There is a central line in the right groin with extra luminal tip. Mild stranding in the right groin and upper thigh attributed to vascular access. A 27 mm (craniocaudal) smooth mass medial to the common femoral artery bifurcation, primarily suspicious for pseudoaneurysm. No discrete hematoma in the groin or abdominal wall or retroperitoneum. Peritoneal: No ascites or pneumoperitoneum. Musculoskeletal: No  acute abnormalities. IMPRESSION: 1. No retroperitoneal or other hematoma to explain dropping hematocrit. 2. Right groin catheter with extraluminal tip. 3. 27 mm right groin mass, favor pseudoaneurysm over hematoma. 4. Small pleural effusions and multi segment atelectasis. 5. Dilated gallbladder without superimposed inflammation. Electronically Signed   By: Marnee SpringJonathon  Watts M.D.   On: 02/08/2016 03:02   [place] Renal Port  02/07/2016  CLINICAL DATA:  Anuria. EXAM: RENAL / URINARY TRACT ULTRASOUND COMPLETE COMPARISON:  None. FINDINGS: Right Kidney: Length: 9 cm. Smooth atrophy favoring chronic arterial compromise. No hydronephrosis. Left Kidney: Length: 12 cm. Echogenicity within normal limits. No mass or hydronephrosis visualized. Bladder: Foley catheter reportedly present with bladder not visualized. IMPRESSION: 1. No hydronephrosis.  Decompressed bladder. 2. Global right renal atrophy. Electronically Signed   By: Marnee SpringJonathon  Watts M.D.   On: 02/07/2016 22:51   Dg Chest Port 1 View  02/07/2016  CLINICAL DATA:  Sepsis. EXAM: PORTABLE CHEST 1 VIEW COMPARISON:  February 06, 2016. FINDINGS: Stable cardiomegaly. No pneumothorax or pleural effusion is noted. No acute pulmonary disease is noted. Status post kyphoplasty of mid thoracic vertebral body. Old left rib fractures are noted. IMPRESSION: No acute cardiopulmonary abnormality seen. Electronically Signed   By: Lupita RaiderJames  Green Jr, M.D.   On: 02/07/2016 18:26   Dg Chest Port 1 View  19-Jun-2016  CLINICAL DATA:  Chest pain. Cold, painful right leg. Atrial fibrillation. History of CHF. EXAM: PORTABLE CHEST 1 VIEW COMPARISON:  None. FINDINGS: The cardiac silhouette is mildly enlarged. Calcification and tortuosity are noted of the thoracic aorta. There is pulmonary vascular congestion with mild diffuse interstitial prominence. No sizable pleural effusion or pneumothorax is identified. Bilateral rib fractures are noted and appear old. A mid thoracic vertebral compression fracture  is noted status post cement augmentation. IMPRESSION: Cardiomegaly with pulmonary vascular congestion and likely mild interstitial edema. Electronically Signed   By: Sebastian AcheAllen  Grady M.D.   On: 006-Sep-2017 13:03    ASSESSMENT AND PLAN  Principal Problem:  Critical lower limb ischemia Active Problems:  Atrial fibrillation with RVR (HCC)  Ischemia of lower extremity   1. Acute Critical Limb Ischemia: in the setting of new onset atrial fibrillation w/ RVR, thus presumably from cardio embolic source. She is s/p emergent right femoral artery embolectomy per Dr. Edilia Boickson. Continue post op management per vascular surgery.   2. New Onset Atrial Fibrillation w/ RVR: Converted to NSR shortly after her surgery and was in NSR 4/26. IV Cardizem stopped 4/26 in the evening due to low BP. She went back into afib with RVR and SVT and ultimately converted to NSR on IV Amio gtt. Based on known risk factors, her calculated CHA2DS2 VASc score is 5 for Age 54>75, embolic event and female sex. Given embolic event, she is at high risk for recurrence and high risk for stroke. She will require anticoagulation, once cleared to start by vascular surgery.  Continue IV heparin for now. Her admit SCr was 2.28 and now 5.66.  We have no baseline labs for comparison.Based on renal function currently, she is not a candidate for NOAC. Would have to use Coumadin with heparin bridge. 2D echoshowed EF 40-45% with mid to apical anteroseptal HK and AK of the mid inferior wall and grade 2 DD, moderately dilated LA and mild pulmonary HTN with PASP . IV Amio now off due to bradycardia. Her HR only transiently drops into the low 40's and she was sleeping.  Would stop BB and continue Amio for now. LFTs elevated on 4/26 ? Related to hypotension - will recheck today since on Amio.   Need to follow platelet count closely which is 100K today on Heparin.    2. Abnormal Troponin: peak trop 0.99 with flat trend. This is most likely secondary to  demand ischemia in the setting of rapid Afib and renal insufficiency. Given mild LV dysfunction with segmental wall motion abnormalities, will require further evaluation to r/o underlying coronary ischemia once she recovers from acute illness. However may be limited to perform cath given renal function. There are no baseline EKGs for comparison. She is currently on IV heparin for afib.   3. Renal Insufficiency: ? AKI vs chronic renal insuffiencey. There are no baseline labs for comparison. BMET this am shows increasing creatinine. She does have evidence of nephrolithiasis on CT and hematuria. ? Whether she could have had an embolic event to her kidney.  Recommend renal consult.   4. Acute combined systolic/diastolic CHF - Most likely exacerbated by acute renal insuff and afib with RVR. BNP mildly elevated and chest xray showed pulmonary vascular congestion with mild interstitial edema. Chest CT showed small pleural effusions with atelectasis. 2D echo with EF 40-45%. She got 1 dose of IV Lasix. Will hold on further diuretics for now given increased creatinine. Follow trend of renal function. Will try to get old records to determine what baseline creatinine is.   The patient is critically ill with multiple organ systems failure and requires high complexity decision making for assessment and support, frequent evaluation and titration of therapies, and extensive interpretation of multiple databases. Time devoted to patient care services described in this note is 35 minutes with >50% of time spent in direct patient care.     Quintella Reichert, MD  02/09/2016  9:23 AM

## 2016-02-09 NOTE — Progress Notes (Addendum)
   VASCULAR SURGERY ASSESSMENT & PLAN:   3 Days Post-Op s/p: Right femoral embolectomy, vein patch angioplasty of the femoral artery, and 4 compartment fasciotomy.   For VAC change today. I will be in the office but PA will try to inspect wound. With her multiple medical issues, I'm reluctant to take her back to the operating room for closure of her fasciotomy site anytime soon.   CARDIAC: New onset atrial fibrillation with RVR: cardiology is following.The patient is on IV amiodarone. She is on IV heparin. From my standpoint, she can start oral anticoagulation at any time. I believe the plan is for Coumadin.   RENAL: creatinine yesterday was 4.99. Today's creatinine is pending. Urine output is marginal.Critical care medicine is following. I suspect that her renal insufficiency is from rhabdomyolysis. I have counseled to nephrology.   JP with minimal drainage. DC JP today.   SUBJECTIVE: No complaints. Pleasantly confused.  PHYSICAL EXAM: Filed Vitals:   02/09/16 0500 02/09/16 0600 02/09/16 0630 02/09/16 0700  BP: 84/53 83/60 95/60  90/53  Pulse: 73 76 74 74  Temp:      TempSrc:      Resp: 18 17 18 17   Height:      Weight:      SpO2: 96% 94% 94% 94%   Right foot is warm and well-perfused. Dressing on right leg is dry. Moderate right lower extremity swelling.  LABS: Lab Results  Component Value Date   WBC 7.2 02/09/2016   HGB 8.9* 02/09/2016   HCT 26.8* 02/09/2016   MCV 90.8 02/09/2016   PLT 100* 02/09/2016   Lab Results  Component Value Date   CREATININE 4.99* 02/08/2016   Lab Results  Component Value Date   INR 1.43 02/07/2016   CBG (last 3)   Recent Labs  2016/04/17 1750  GLUCAP 194*    Principal Problem:   Critical lower limb ischemia Active Problems:   Atrial fibrillation with RVR (HCC)   Ischemia of lower extremity   Acute systolic CHF (congestive heart failure) (HCC)   Elevated troponin   Hypotension   Acute post-hemorrhagic anemia    Anuria   Cari CarawayChris Tasnim Balentine Beeper: [phone] 02/09/2016

## 2016-02-09 NOTE — Progress Notes (Signed)
  Per Dr Delton CoombesByrum, Nephrology service wants to start CVVH within the next 24 hrs.  Currently on heparin drip.  Pt comfortably sleeping. 78/60, 89, 20,  98% on 2L. Volume overloaded.   Plan : 1. Hold off on heparin for now. 2. Check PT/INR now. Heparin levels have  been subtherapeutic.  3. Plan to place HD cath tonight. Dr. Delton CoombesByrum will ask night PCCM MD to facilitate.  4. Likely will need CVVH in am. No urgent indication for CVVH tonight unless renal thinks otherwise.  5. Will hold off on additional IVFs as BP has been low normal last 24 hrs and MAPs 65 and pt is volume overloaded. 6. Daughter aware of CVVH.  7. Plan d/w nurse.    Pollie MeyerJ. Angelo A de Dios, MD 02/09/2016, 4:39 PM [facility] Pager [phone] After 3 pm or if no answer, call [phone]

## 2016-02-09 NOTE — Consult Note (Signed)
[facility] Renal Consultation Note  Requesting MD:  Indication for Consultation:   HPI:  Valerie Cordova is a 82 y.o. female that was admitted on 4/25 for RLE ischemia and new onset Afib with RVR.  PMH dementia.  She is s/p R femoral embolectomy, vein patch angioplasty and 4 compartment fasciotomy.  Baseline renal function unknown.    Patient unable to answer ROS, as she would not cooperate with exam.  CREATININE, SER  Date/Time Value Ref Range Status  02/09/2016 06:18 AM 5.66* 0.44 - 1.00 mg/dL Final  02/08/2016 06:26 PM 4.99* 0.44 - 1.00 mg/dL Final  02/08/2016 11:10 AM 4.54* 0.44 - 1.00 mg/dL Final  02/08/2016 02:45 AM 4.11* 0.44 - 1.00 mg/dL Final  02/07/2016 08:59 PM 2.93* 0.44 - 1.00 mg/dL Final  02/07/2016 01:28 AM 2.41* 0.44 - 1.00 mg/dL Final  02/02/2016 12:36 PM 2.28* 0.44 - 1.00 mg/dL Final     PMHx:   Past Medical History  Diagnosis Date  . Vitamin D deficiency   . Vitamin B12 deficiency   . Abnormal uterine and vaginal bleeding, unspecified   . Gout   . Atherosclerosis of abdominal aorta ([place])   . UTI (lower urinary tract infection)   . CHF (congestive heart failure) ([place])   . Ceruminosis   . Dysphagia   . Iron deficiency anemia   . Alzheimer disease   . Depression   . Nephrolithiasis   . GERD (gastroesophageal reflux disease)   . Thyroid disease     hypo    Past Surgical History  Procedure Laterality Date  . Embolectomy Right 01/19/2016    Procedure: RIGHT FEMORAL EMBOLECTOMY;  Surgeon: Angelia Mould, MD;  Location: [facility];  Service: Vascular;  Laterality: Right;  . Patch angioplasty Right 02/09/2016    Procedure: [place];  Surgeon: Angelia Mould, MD;  Location: [facility];  Service: Vascular;  Laterality: Right;  . Fasciotomy Right 02/11/2016    Procedure: RIGHT LOWER LEG FASCIOTOMY ;  Surgeon: Angelia Mould, MD;  Location: [facility];  Service: Vascular;  Laterality: Right;  . Application of wound vac Right  02/05/2016    Procedure: APPLICATION OF WOUND VAC;  Surgeon: Angelia Mould, MD;  Location: [facility] OR;  Service: Vascular;  Laterality: Right;    Family Hx:  Family History  Problem Relation Age of Onset  . Hypertension Mother     Social History:  has no tobacco, alcohol, and drug history on file.  Allergies:  Allergies  Allergen Reactions  . Codeine Itching and Nausea And Vomiting  . Vicodin [Hydrocodone-Acetaminophen] Itching and Nausea And Vomiting    Medications: Prior to Admission medications   Medication Sig Start Date End Date Taking? Authorizing Provider  acetaminophen (TYLENOL) 325 MG tablet Take 650 mg by mouth every 6 (six) hours as needed for mild pain.   Yes Historical Provider, MD  citalopram (CELEXA) 20 MG tablet Take 20 mg by mouth daily.   Yes Historical Provider, MD  colchicine 0.6 MG tablet Take 0.6 mg by mouth daily.   Yes Historical Provider, MD  cyanocobalamin (,VITAMIN B-12,) 1000 MCG/ML injection Inject 2,000 mcg into the muscle every 30 (thirty) days.    Yes Historical Provider, MD  donepezil (ARICEPT) 10 MG tablet Take 10 mg by mouth at bedtime.   Yes Historical Provider, MD  ferrous sulfate 325 (65 FE) MG tablet Take 325 mg by mouth daily with breakfast.   Yes Historical Provider, MD  furosemide (LASIX) 20 MG tablet Take 30  mg by mouth See admin instructions. Monday-Friday   Yes Historical Provider, MD  haloperidol (HALDOL) 0.5 MG tablet Take 0.5 mg by mouth 2 (two) times daily as needed for agitation.   Yes Historical Provider, MD  levothyroxine (SYNTHROID, LEVOTHROID) 75 MCG tablet Take 75 mcg by mouth daily before breakfast.   Yes Historical Provider, MD  Melatonin 5 MG TABS Take 5 mg by mouth at bedtime.   Yes Historical Provider, MD  metoprolol succinate (TOPROL-XL) 25 MG 24 hr tablet Take 12.5 mg by mouth daily.   Yes Historical Provider, MD  pantoprazole (PROTONIX) 40 MG tablet Take 40 mg by mouth daily.   Yes Historical Provider, MD   sulfamethoxazole-trimethoprim (BACTRIM DS,SEPTRA DS) 800-160 MG tablet Take 2 tablets by mouth 2 (two) times daily. 02/02/16  Yes Historical Provider, MD    I have reviewed the patient's current medications.  Labs:  Results for orders placed or performed during the hospital encounter of 02/03/2016 (from the past 48 hour(s))  Troponin I     Status: Abnormal   Collection Time: 02/07/16  2:37 PM  Result Value Ref Range   Troponin I 0.94 (HH) <0.031 ng/mL    Comment:        POSSIBLE MYOCARDIAL ISCHEMIA. SERIAL TESTING RECOMMENDED. CRITICAL VALUE NOTED.  VALUE IS CONSISTENT WITH PREVIOUSLY REPORTED AND CALLED VALUE.   Culture, blood (routine x 2)     Status: None (Preliminary result)   Collection Time: 02/07/16  6:42 PM  Result Value Ref Range   Specimen Description BLOOD RIGHT ANTECUBITAL    Special Requests IN PEDIATRIC BOTTLE 1.5CC    Culture NO GROWTH < 24 HOURS    Report Status PENDING   Culture, blood (routine x 2)     Status: None (Preliminary result)   Collection Time: 02/07/16  6:42 PM  Result Value Ref Range   Specimen Description BLOOD RIGHT ARM    Special Requests BOTTLES DRAWN AEROBIC AND ANAEROBIC 5CC     Culture NO GROWTH < 24 HOURS    Report Status PENDING   Lactic acid, plasma     Status: None   Collection Time: 02/07/16  6:48 PM  Result Value Ref Range   Lactic Acid, Venous 1.8 0.5 - 2.0 mmol/L  Procalcitonin     Status: None   Collection Time: 02/07/16  6:48 PM  Result Value Ref Range   Procalcitonin 1.18 ng/mL    Comment:        Interpretation: PCT > 0.5 ng/mL and <= 2 ng/mL: Systemic infection (sepsis) is possible, but other conditions are known to elevate PCT as well. (NOTE)         ICU PCT Algorithm               Non ICU PCT Algorithm    ----------------------------     ------------------------------         PCT < 0.25 ng/mL                 PCT < 0.1 ng/mL     Stopping of antibiotics            Stopping of antibiotics       strongly encouraged.                strongly encouraged.    ----------------------------     ------------------------------       PCT level decrease by               PCT < 0.25 ng/mL       >=  80% from peak PCT       OR PCT 0.25 - 0.5 ng/mL          Stopping of antibiotics                                             encouraged.     Stopping of antibiotics           encouraged.    ----------------------------     ------------------------------       PCT level decrease by              PCT >= 0.25 ng/mL       < 80% from peak PCT        AND PCT >= 0.5 ng/mL             Continuing antibiotics                                              encouraged.       Continuing antibiotics            encouraged.    ----------------------------     ------------------------------     PCT level increase compared          PCT > 0.5 ng/mL         with peak PCT AND          PCT >= 0.5 ng/mL             Escalation of antibiotics                                          strongly encouraged.      Escalation of antibiotics        strongly encouraged.   Protime-INR     Status: Abnormal   Collection Time: 02/07/16  6:48 PM  Result Value Ref Range   Prothrombin Time 17.5 (H) 11.6 - 15.2 seconds   INR 1.43 0.00 - 1.49  APTT     Status: Abnormal   Collection Time: 02/07/16  6:48 PM  Result Value Ref Range   aPTT 73 (H) 24 - 37 seconds    Comment:        IF BASELINE aPTT IS ELEVATED, SUGGEST PATIENT RISK ASSESSMENT BE USED TO DETERMINE APPROPRIATE ANTICOAGULANT THERAPY.   Heparin level (unfractionated)     Status: Abnormal   Collection Time: 02/07/16  8:59 PM  Result Value Ref Range   Heparin Unfractionated 2.12 (H) 0.30 - 0.70 IU/mL    Comment: RESULTS CONFIRMED BY MANUAL DILUTION CORRECTED RESULTS CALLED TO: Thomasenia Bottoms RN [id number] GREEN R        IF HEPARIN RESULTS ARE BELOW EXPECTED VALUES, AND PATIENT DOSAGE HAS BEEN CONFIRMED, SUGGEST FOLLOW UP TESTING OF ANTITHROMBIN III LEVELS. CORRECTED ON 04/27 AT 0925: PREVIOUSLY  REPORTED AS >2.12 RESULTS CONFIRMED BY MANUAL DILUTION        IF HEPARIN RESULTS ARE BELOW EXPECTED VALUES, AND PATIENT DOSAGE HAS BEEN CONFIRMED, SUGGEST FOLLOW UP TESTING OF ANTITHROMBIN III LEVELS.   Lactic acid, plasma     Status: None   Collection Time: 02/07/16  8:59 PM  Result Value Ref Range   Lactic Acid, Venous 1.7 0.5 - 2.0 mmol/L  Troponin I     Status: Abnormal   Collection Time: 02/07/16  8:59 PM  Result Value Ref Range   Troponin I 0.68 (HH) <0.031 ng/mL    Comment:        POSSIBLE MYOCARDIAL ISCHEMIA. SERIAL TESTING RECOMMENDED. CRITICAL VALUE NOTED.  VALUE IS CONSISTENT WITH PREVIOUSLY REPORTED AND CALLED VALUE.   Magnesium     Status: Abnormal   Collection Time: 02/07/16  8:59 PM  Result Value Ref Range   Magnesium 1.2 (L) 1.7 - 2.4 mg/dL  Basic metabolic panel     Status: Abnormal   Collection Time: 02/07/16  8:59 PM  Result Value Ref Range   Sodium 133 (L) 135 - 145 mmol/L   Potassium 3.7 3.5 - 5.1 mmol/L    Comment: DELTA CHECK NOTED   Chloride 98 (L) 101 - 111 mmol/L   CO2 23 22 - 32 mmol/L   Glucose, Bld 98 65 - 99 mg/dL   BUN 17 6 - 20 mg/dL   Creatinine, Ser 2.93 (H) 0.44 - 1.00 mg/dL   Calcium 5.6 (LL) 8.9 - 10.3 mg/dL    Comment: CRITICAL RESULT CALLED TO, READ BACK BY AND VERIFIED WITH: SMITH M,RN 02/07/16 2210 WAYK    GFR calc non Af Amer 14 (L) >60 mL/min   GFR calc Af Amer 16 (L) >60 mL/min    Comment: (NOTE) The eGFR has been calculated using the CKD EPI equation. This calculation has not been validated in all clinical situations. eGFR's persistently <60 mL/min signify possible Chronic Kidney Disease.    Anion gap 12 5 - 15  CBC     Status: Abnormal   Collection Time: 02/07/16  8:59 PM  Result Value Ref Range   WBC 7.9 4.0 - 10.5 K/uL   RBC 2.48 (L) 3.87 - 5.11 MIL/uL   Hemoglobin 7.6 (L) 12.0 - 15.0 g/dL    Comment: REPEATED TO VERIFY CRITICAL RESULT CALLED TO, READ BACK BY AND VERIFIED WITH: Biagio Quint RN [id number] GREEN R    HCT  23.1 (L) 36.0 - 46.0 %   MCV 93.1 78.0 - 100.0 fL   MCH 30.6 26.0 - 34.0 pg   MCHC 32.9 30.0 - 36.0 g/dL   RDW 14.3 11.5 - 15.5 %   Platelets 113 (L) 150 - 400 K/uL    Comment: SPECIMEN CHECKED FOR CLOTS PLATELET COUNT CONFIRMED BY SMEAR LARGE PLATELETS PRESENT   Hepatic function panel     Status: Abnormal   Collection Time: 02/07/16  8:59 PM  Result Value Ref Range   Total Protein 4.1 (L) 6.5 - 8.1 g/dL   Albumin 2.4 (L) 3.5 - 5.0 g/dL   AST 395 (H) 15 - 41 U/L   ALT 96 (H) 14 - 54 U/L   Alkaline Phosphatase 34 (L) 38 - 126 U/L   Total Bilirubin 0.5 0.3 - 1.2 mg/dL   Bilirubin, Direct 0.1 0.1 - 0.5 mg/dL   Indirect Bilirubin 0.4 0.3 - 0.9 mg/dL  Blood gas, arterial     Status: Abnormal   Collection Time: 02/07/16 10:26 PM  Result Value Ref Range   O2 Content 4.0 L/min   Delivery systems NASAL CANNULA    pH, Arterial 7.397 7.350 - 7.450   pCO2 arterial 32.0 (L) 35.0 - 45.0 mmHg   pO2, Arterial 79.9 (L) 80.0 - 100.0 mmHg   Bicarbonate 19.3 (L) 20.0 - 24.0 mEq/L  TCO2 20.3 0 - 100 mmol/L   Acid-base deficit 4.7 (H) 0.0 - 2.0 mmol/L   O2 Saturation 95.7 %   Patient temperature 98.1    Collection site BRACHIAL ARTERY    Drawn by [phone]    Sample type ARTERIAL    Allens test (pass/fail) PASS PASS  Sodium, urine, random     Status: None   Collection Time: 02/08/16 12:11 AM  Result Value Ref Range   Sodium, Ur 82 mmol/L  Type and screen [place]     Status: None   Collection Time: 02/08/16  1:29 AM  Result Value Ref Range   ABO/RH(D) B POS    Antibody Screen NEG    Sample Expiration 02/11/2016    Unit Number [id number]    Blood Component Type RED CELLS,LR    Unit division 00    Status of Unit ISSUED,FINAL    Transfusion Status OK TO TRANSFUSE    Crossmatch Result Compatible   Prepare RBC     Status: None   Collection Time: 02/08/16  1:29 AM  Result Value Ref Range   Order Confirmation ORDER PROCESSED BY BLOOD BANK   ABO/Rh     Status: None    Collection Time: 02/08/16  1:29 AM  Result Value Ref Range   ABO/RH(D) B POS   Basic metabolic panel     Status: Abnormal   Collection Time: 02/08/16  2:45 AM  Result Value Ref Range   Sodium 135 135 - 145 mmol/L   Potassium 4.6 3.5 - 5.1 mmol/L    Comment: DELTA CHECK NOTED   Chloride 105 101 - 111 mmol/L   CO2 18 (L) 22 - 32 mmol/L   Glucose, Bld 108 (H) 65 - 99 mg/dL   BUN 25 (H) 6 - 20 mg/dL   Creatinine, Ser 4.11 (H) 0.44 - 1.00 mg/dL    Comment: DELTA CHECK NOTED   Calcium 7.4 (L) 8.9 - 10.3 mg/dL   GFR calc non Af Amer 9 (L) >60 mL/min   GFR calc Af Amer 11 (L) >60 mL/min    Comment: (NOTE) The eGFR has been calculated using the CKD EPI equation. This calculation has not been validated in all clinical situations. eGFR's persistently <60 mL/min signify possible Chronic Kidney Disease.    Anion gap 12 5 - 15  Magnesium     Status: None   Collection Time: 02/08/16  2:45 AM  Result Value Ref Range   Magnesium 2.1 1.7 - 2.4 mg/dL  CBC     Status: Abnormal   Collection Time: 02/08/16  2:50 AM  Result Value Ref Range   WBC 8.1 4.0 - 10.5 K/uL   RBC 2.81 (L) 3.87 - 5.11 MIL/uL   Hemoglobin 8.3 (L) 12.0 - 15.0 g/dL   HCT 25.9 (L) 36.0 - 46.0 %   MCV 92.2 78.0 - 100.0 fL   MCH 29.5 26.0 - 34.0 pg   MCHC 32.0 30.0 - 36.0 g/dL   RDW 14.4 11.5 - 15.5 %   Platelets 119 (L) 150 - 400 K/uL    Comment: CONSISTENT WITH PREVIOUS RESULT  Heparin level (unfractionated)     Status: Abnormal   Collection Time: 02/08/16 11:10 AM  Result Value Ref Range   Heparin Unfractionated <0.10 (L) 0.30 - 0.70 IU/mL    Comment:        IF HEPARIN RESULTS ARE BELOW EXPECTED VALUES, AND PATIENT DOSAGE HAS BEEN CONFIRMED, SUGGEST FOLLOW UP TESTING OF ANTITHROMBIN III LEVELS. REPEATED TO  VERIFY SPECIMEN CHECKED FOR CLOTS   Troponin I     Status: Abnormal   Collection Time: 02/08/16 11:10 AM  Result Value Ref Range   Troponin I 0.61 (HH) <0.031 ng/mL    Comment:        POSSIBLE  MYOCARDIAL ISCHEMIA. SERIAL TESTING RECOMMENDED. CRITICAL VALUE NOTED.  VALUE IS CONSISTENT WITH PREVIOUSLY REPORTED AND CALLED VALUE.   CBC     Status: Abnormal   Collection Time: 02/08/16 11:10 AM  Result Value Ref Range   WBC 9.3 4.0 - 10.5 K/uL   RBC 3.15 (L) 3.87 - 5.11 MIL/uL   Hemoglobin 9.3 (L) 12.0 - 15.0 g/dL   HCT 28.6 (L) 36.0 - 46.0 %   MCV 90.8 78.0 - 100.0 fL   MCH 29.5 26.0 - 34.0 pg   MCHC 32.5 30.0 - 36.0 g/dL   RDW 16.1 (H) 11.5 - 15.5 %   Platelets 112 (L) 150 - 400 K/uL    Comment: CONSISTENT WITH PREVIOUS RESULT  Basic metabolic panel     Status: Abnormal   Collection Time: 02/08/16 11:10 AM  Result Value Ref Range   Sodium 135 135 - 145 mmol/L   Potassium 4.9 3.5 - 5.1 mmol/L   Chloride 105 101 - 111 mmol/L   CO2 18 (L) 22 - 32 mmol/L   Glucose, Bld 112 (H) 65 - 99 mg/dL   BUN 27 (H) 6 - 20 mg/dL   Creatinine, Ser 4.54 (H) 0.44 - 1.00 mg/dL   Calcium 7.4 (L) 8.9 - 10.3 mg/dL   GFR calc non Af Amer 8 (L) >60 mL/min   GFR calc Af Amer 10 (L) >60 mL/min    Comment: (NOTE) The eGFR has been calculated using the CKD EPI equation. This calculation has not been validated in all clinical situations. eGFR's persistently <60 mL/min signify possible Chronic Kidney Disease.    Anion gap 12 5 - 15  Troponin I     Status: Abnormal   Collection Time: 02/08/16  6:26 PM  Result Value Ref Range   Troponin I 0.61 (HH) <0.031 ng/mL    Comment:        POSSIBLE MYOCARDIAL ISCHEMIA. SERIAL TESTING RECOMMENDED. CRITICAL VALUE NOTED.  VALUE IS CONSISTENT WITH PREVIOUSLY REPORTED AND CALLED VALUE.   Basic metabolic panel     Status: Abnormal   Collection Time: 02/08/16  6:26 PM  Result Value Ref Range   Sodium 134 (L) 135 - 145 mmol/L   Potassium 4.8 3.5 - 5.1 mmol/L   Chloride 102 101 - 111 mmol/L   CO2 18 (L) 22 - 32 mmol/L   Glucose, Bld 115 (H) 65 - 99 mg/dL   BUN 29 (H) 6 - 20 mg/dL   Creatinine, Ser 4.99 (H) 0.44 - 1.00 mg/dL   Calcium 7.5 (L) 8.9 - 10.3  mg/dL   GFR calc non Af Amer 7 (L) >60 mL/min   GFR calc Af Amer 9 (L) >60 mL/min    Comment: (NOTE) The eGFR has been calculated using the CKD EPI equation. This calculation has not been validated in all clinical situations. eGFR's persistently <60 mL/min signify possible Chronic Kidney Disease.    Anion gap 14 5 - 15  Heparin level (unfractionated)     Status: Abnormal   Collection Time: 02/08/16 10:17 PM  Result Value Ref Range   Heparin Unfractionated <0.10 (L) 0.30 - 0.70 IU/mL    Comment:        IF HEPARIN RESULTS ARE BELOW EXPECTED VALUES,  AND PATIENT DOSAGE HAS BEEN CONFIRMED, SUGGEST FOLLOW UP TESTING OF ANTITHROMBIN III LEVELS. REPEATED TO VERIFY   Troponin I     Status: Abnormal   Collection Time: 02/08/16 11:36 PM  Result Value Ref Range   Troponin I 0.55 (HH) <0.031 ng/mL    Comment:        POSSIBLE MYOCARDIAL ISCHEMIA. SERIAL TESTING RECOMMENDED. CRITICAL VALUE NOTED.  VALUE IS CONSISTENT WITH PREVIOUSLY REPORTED AND CALLED VALUE.   CBC     Status: Abnormal   Collection Time: 02/09/16  6:18 AM  Result Value Ref Range   WBC 7.2 4.0 - 10.5 K/uL   RBC 2.95 (L) 3.87 - 5.11 MIL/uL   Hemoglobin 8.9 (L) 12.0 - 15.0 g/dL   HCT 26.8 (L) 36.0 - 46.0 %   MCV 90.8 78.0 - 100.0 fL   MCH 30.2 26.0 - 34.0 pg   MCHC 33.2 30.0 - 36.0 g/dL   RDW 16.4 (H) 11.5 - 15.5 %   Platelets 100 (L) 150 - 400 K/uL    Comment: CONSISTENT WITH PREVIOUS RESULT  Basic metabolic panel     Status: Abnormal   Collection Time: 02/09/16  6:18 AM  Result Value Ref Range   Sodium 134 (L) 135 - 145 mmol/L   Potassium 4.4 3.5 - 5.1 mmol/L   Chloride 99 (L) 101 - 111 mmol/L   CO2 19 (L) 22 - 32 mmol/L   Glucose, Bld 108 (H) 65 - 99 mg/dL   BUN 33 (H) 6 - 20 mg/dL   Creatinine, Ser 5.66 (H) 0.44 - 1.00 mg/dL   Calcium 7.6 (L) 8.9 - 10.3 mg/dL   GFR calc non Af Amer 6 (L) >60 mL/min   GFR calc Af Amer 7 (L) >60 mL/min    Comment: (NOTE) The eGFR has been calculated using the CKD EPI  equation. This calculation has not been validated in all clinical situations. eGFR's persistently <60 mL/min signify possible Chronic Kidney Disease.    Anion gap 16 (H) 5 - 15  Troponin I     Status: Abnormal   Collection Time: 02/09/16  6:18 AM  Result Value Ref Range   Troponin I 0.46 (H) <0.031 ng/mL    Comment:        PERSISTENTLY INCREASED TROPONIN VALUES IN THE RANGE OF 0.04-0.49 ng/mL CAN BE SEEN IN:       -UNSTABLE ANGINA       -CONGESTIVE HEART FAILURE       -MYOCARDITIS       -CHEST TRAUMA       -ARRYHTHMIAS       -LATE PRESENTING MYOCARDIAL INFARCTION       -COPD   CLINICAL FOLLOW-UP RECOMMENDED.   Heparin level (unfractionated)     Status: Abnormal   Collection Time: 02/09/16  6:18 AM  Result Value Ref Range   Heparin Unfractionated <0.10 (L) 0.30 - 0.70 IU/mL    Comment:        IF HEPARIN RESULTS ARE BELOW EXPECTED VALUES, AND PATIENT DOSAGE HAS BEEN CONFIRMED, SUGGEST FOLLOW UP TESTING OF ANTITHROMBIN III LEVELS. REPEATED TO VERIFY      ROS:  Review of systems not obtained due to patient factors.  Physical Exam: Filed Vitals:   02/09/16 1030 02/09/16 1100  BP: 93/61 84/72  Pulse: 74 68  Temp:    Resp: 21 16     General: resting, rouses easily. HEENT: MMM, poor dentition. Ocular exam not performed d/t inability to cooperate with exam Neck: No JVD Heart:  RRR Lungs: CTAB, normal WOB on room air Abdomen: obese, NT/ND, +BS Extremities: RLE with +1 edema to ankle (calf wrapped with drain in place), LLE with minimal edema Skin: wound w/ drain in place of RLE Neuro: Oriented to self.  Child like demeanor.  Intermittently cooperates with exam.  CK [address] LDH 786   Assessment/Plan: 1.Renal- AKI in the setting of hypotension/ rhabdomyolysis/ decreased renal perfusion.  Likely has renal artery stenosis, given atrophic right kidney on renal u/s 4/26.  Unsure of baseline renal function, as there are no prior records.  However Cr on admission 2.28.  Now  5.66.  Has had low UOP since admission.  Now making <200cc.  Discussed with vascular, no contrast has been used this admission.  Daily RFP. Urine Na, Cr, eosinophils ordered.  SPEP, UPEP ordered.  Renal artery doppler also ordered.  Daily weights, strict I/Os. 2. Hypertension/volume  - BP soft 70-17B systolic.  Euvolemic. 3. New onset Afib- NSR now.  On Heparin gtt and amiodarone. Management per Cards 4. Anemia  - Hgb 8.9 5. Dementia - Aricept per primary 6. Acute combined systolic/diastolic CHF 7. Hypothyroidism    May need HD this admission.  Will ask CCM to place access.  Ronnie Doss, DO 02/09/2016, 11:09 AM   I have seen and examined this patient and agree with plan as outlined by Dr. Lajuana Ripple.  Unfortunate woman with dementia, HTN, atrophic right kidney (likely due to renal artery stenosis), CKD (unclear if 2.2 is her baseline as we have no other records to review), and new onset A fib with embolization of right femoral artery s/p embolectomy further complicated by compartment syndrome requiring fasciotomy.  She was hypotensive and had some ABLA as well as developed rhabdomyolysis with CPK level >20,000.  She has had oliguria and ARF.  Agree with current management of IVF's and will continue to follow, however she may need to have HD if her renal function does not significantly improve.  We may need to have a discussion with family regarding goals of care.  No indication for HD at this time.  Will continue to follow.  We also have ordered renal artery duplex to r/o occlusion of her renal arteries (since she embolized to femoral artery).  Discussed case with Dr. Scot Dock who does not think she would be a candidate for revascularization if this were the case and may change the direction of care. Broadus John A Noor Vidales,MD 02/09/2016 5:08 PM

## 2016-02-09 NOTE — Progress Notes (Signed)
Pt HR 70's at this time; SR with frequent PAC's; pt at times brady with HR 40's-50's; Amio gtt stopped at this time; Dr. Mayford Knifeurner paged to make aware; no new orders at this time; cardiology to see pt soon; will cont. To monitor.  Valerie Cordova, Valerie Cordova

## 2016-02-09 NOTE — Progress Notes (Signed)
PA made aware of AM labs Creat 5.66; plan to consult renal today; will cont. To monitor.  Benjamine SpragueYates, Antuan Limes A

## 2016-02-09 NOTE — Progress Notes (Signed)
PULMONARY / CRITICAL CARE MEDICINE   Name: Valerie Cordova MRN: [id number] DOB: 12-01-1931    ADMISSION DATE:  02/04/2016 CONSULTATION DATE:  02/07/2016  REFERRING MD:  Dr. Susie Cassette  CHIEF COMPLAINT:  Oliguric, hypotensive  HISTORY OF PRESENT ILLNESS:   Valerie Cordova is a 82 y.o. woman with medical history as detailed below.  Admitted on 4/25.  She has history of dementia and unable to contribute to HPI.  Daughter is present in the room.  She has no significant cardiac history other than CHF per the chart, but came in with acute critical limb ischemia secondary to an acute right lower extremity arterial occlusion.  She was also found to be in new onset atrial fibrillation with RVR.  Went to OR on 4/25 for emergent right femoral artery embolectomy with Dr. Edilia Bo.  Her atrial fibrillation has been managed thus far by cardiology.  She is on heparin gtt and cardizem gtt, plus beta blocker PRN.  She also had a TTE on 4/26 with LVEF 40-45%, diffuse hypokinesis, grade 2 DD.    Since hospitalization, patient also has put out very little urine ( today) and her renal function has deteriorated with rising serum creatine.  She has also been having hematuria since yesterday.  Per review of her recent records from [place], creatine was 1.1 in February and 1.5 earlier this month.  She has history of recurrent UTI (currently being treated with Bactrim outpatient and recently finished course of fluoroquinolone) with E. Coli and Enterococcus on culture from [place].  She also has history of kidney stones and follows with a urologist.  PCCM has been consulted due to concern for hypotension and acute renal injury.   SUBJECTIVE:  Afebrile Poor UO Bp remains soft Denies pain  VITAL SIGNS: BP 99/78 mmHg  Pulse 64  Temp(Src) 98.3 F (36.8 C) (Oral)  Resp 16  Ht  (1.575 m)  Wt 192 lb 0.3 oz (87.1 kg)  BMI 35.11 kg/m2  SpO2 100%  HEMODYNAMICS:    VENTILATOR SETTINGS:    INTAKE / OUTPUT: I/O  last 3 completed shifts: In: 4786.7 [P.O.:390; I.V.:3111.7; Blood:335; IV Piggyback:950] Out: 412 [Urine:207; Drains:205]  PHYSICAL EXAMINATION: General: resting in bed, not in distress, elderly appearing woman HEENT: [place]/AT, anicteric sclera Cardiac: irregular rhythm, regular rate, no rubs, murmurs or gallops Pulm: no increased work of breathing, moving normal volumes of air Abd: soft, nontender, nondistended Ext: warm and well perfused, no pedal edema, right leg wrapped in Ace bandage, rt groin drain Neuro: no focal deficits, alert, cooperative, following commands   LABS:  BMET  Recent Labs Lab 02/08/16 1110 02/08/16 1826 02/09/16 0618  NA 135 134* 134*  K 4.9 4.8 4.4  CL 105 102 99*  CO2 18* 18* 19*  BUN 27* 29* 33*  CREATININE 4.54* 4.99* 5.66*  GLUCOSE 112* 115* 108*    Electrolytes  Recent Labs Lab 01/21/2016 1236  02/07/16 2059 02/08/16 0245 02/08/16 1110 02/08/16 1826 02/09/16 0618  CALCIUM 8.7*  < > 5.6* 7.4* 7.4* 7.5* 7.6*  MG 1.7  --  1.2* 2.1  --   --   --   < > = values in this interval not displayed.  CBC  Recent Labs Lab 02/08/16 0250 02/08/16 1110 02/09/16 0618  WBC 8.1 9.3 7.2  HGB 8.3* 9.3* 8.9*  HCT 25.9* 28.6* 26.8*  PLT 119* 112* 100*    Coag's  Recent Labs Lab 01/28/2016 1236 02/07/16 1848  APTT  --  73*  INR 1.19 1.43  Sepsis Markers  Recent Labs Lab 02/07/16 1848 02/07/16 2059  LATICACIDVEN 1.8 1.7  PROCALCITON 1.18  --     ABG  Recent Labs Lab 02/07/16 2226  PHART 7.397  PCO2ART 32.0*  PO2ART 79.9*    Liver Enzymes  Recent Labs Lab 02/07/16 2059  AST 395*  ALT 96*  ALKPHOS 34*  BILITOT 0.5  ALBUMIN 2.4*    Cardiac Enzymes  Recent Labs Lab 02/08/16 1826 02/08/16 2336 02/09/16 0618  TROPONINI 0.61* 0.55* 0.46*    Glucose  Recent Labs Lab August 03, 2016 1750  GLUCAP 194*    Imaging No results found.   STUDIES:  4/25 CXR > Cardiomegaly with pulmonary vascular congestion and likely  mild interstitial edema 4/26 Renal ultrasound > rt renal atrophy 4/26 CT abdomen w/o contrast > no RP bleed, rt femoral pseudoaneurysm 27 mm Echo -EF 40%, WMA +, gr 2 DD  CULTURES: 4/26 Blood cx > 4/25 Urine cx > 6,000 colonies/mL insignificant growth   ANTIBIOTICS: Vancomycin 4/26 >4/28 Zosyn 4/26 > TMP-SMX 4/25 > 4/25  SIGNIFICANT EVENTS: 4/25 Admit with critical limb ischemia, taken to OR with Dr. Edilia Boickson.  Also found to have atrial fibrillation with RVR so cardiology was consulted 4/26 [facility] consulted.  Worsening renal function and some hypotension prompting PCCM consult   LINES/TUBES: Foley 4/25 PIV x 3 4/25 Wound Vac 4/25  DISCUSSION: 82 y/o female with dementia but no prior cardiac history admitted for acute critical limb ischemia secondary to acute right LE arterial occlusion. Found to be in new onset atrial fibrillation. She was taken to OR emergently by vascular surgery for right femoral embolectomy per Dr. Edilia Boickson On 4/25. Cardiology consulted for new onset atrial fibrillation. Hospitalist consulted for medical management. Patient found to have systolic blood pressures in the 80s over the last 24 hours with oliguric renal failure  ASSESSMENT / PLAN:  PULMONARY A: Acute hypoxic resp failure due to pulmonary edema P:   - Supplemental oxygen as needed  CARDIOVASCULAR A:  New Onset Atrial Fibrillation w/ RVR - management per cardiology Hypotension Elevated troponin Critical Limb Ischemia s/p right femoral embolectomy Non-sustained V-tach, WCT - on amio gtt Acute CHF - likely more diastolic.  Echo results as above P:  - Amio  gtt and heparin gtt per cardiology, BB stopped due to brady    RENAL A:   AKI - unclear etiology at this point.  Diff Dx includes: ATN from hypotension, obstruction, medication (TMP-SMX prior to admit).  Baseline cr? 2.3 on admit Hypocalcemia Hypomagnesium AG Metabolic Acidosis - better P:   -  follow BMET -bicarb/ Acetate gtt @  50/h - correct electrolytes - follow UOP, I/O -  renal consult  GASTROINTESTINAL A:   No acute issues P:   - clear liquid diet - PPI  HEMATOLOGIC A:   DVT PPx Acute blood loss anemia - Hb drop from 12 to 8, no RP bleed P:  - heparin gtt on hold due to Hb drop  INFECTIOUS A:   ? Urinary tract infection with ? Sepsis physiology -was treated outpatient for UTI with Bactrim and Fluoroquinolone  P:   - recent urine culture looks okay - vanc and zosyn added by [facility], dc vanc - follow CBC, fevers  ENDOCRINE A:   Hypothyroidism   P:   - continue synthroid  NEUROLOGIC A:   Hx of Alzheimers Hx of Depression P:   - Aricept QHS - Celexa daily     FAMILY  - Updates:daughter at bedside 4/27  -  Inter-disciplinary family meet or Palliative Care meeting due by:  day 7  Summary - Waiting to see whether we need HD here while she recovers from embolectomy after this sudden embolic event due to AF  The patient is critically ill with multiple organ systems failure and requires high complexity decision making for assessment and support, frequent evaluation and titration of therapies, application of advanced monitoring technologies and extensive interpretation of multiple databases. Critical Care Time devoted to patient care services described in this note independent of APP time is 31 minutes.   Cyril Mourning MD. Tonny Bollman. [facility] Pager [phone] If no response call [phone]   02/09/2016     02/09/2016, 12:52 PM

## 2016-02-10 LAB — RENAL FUNCTION PANEL
Albumin: 2.2 g/dL — ABNORMAL LOW (ref 3.5–5.0)
Anion gap: 12 (ref 5–15)
BUN: 34 mg/dL — AB (ref 6–20)
CALCIUM: 7.4 mg/dL — AB (ref 8.9–10.3)
CHLORIDE: 98 mmol/L — AB (ref 101–111)
CO2: 25 mmol/L (ref 22–32)
CREATININE: 6.24 mg/dL — AB (ref 0.44–1.00)
GFR, EST AFRICAN AMERICAN: 7 mL/min — AB (ref >60)
GFR, EST NON AFRICAN AMERICAN: 6 mL/min — AB (ref >60)
Glucose, Bld: 116 mg/dL — ABNORMAL HIGH (ref 65–99)
Phosphorus: 5.8 mg/dL — ABNORMAL HIGH (ref 2.5–4.6)
Potassium: 4.1 mmol/L (ref 3.5–5.1)
SODIUM: 135 mmol/L (ref 135–145)

## 2016-02-10 LAB — TROPONIN I
Troponin I: 0.3 ng/mL — ABNORMAL HIGH (ref <0.031)
Troponin I: 0.28 ng/mL — ABNORMAL HIGH (ref <0.031)
Troponin I: 0.23 ng/mL — ABNORMAL HIGH (ref <0.031)

## 2016-02-10 LAB — HEPARIN LEVEL (UNFRACTIONATED)
Heparin Unfractionated: 0.1 IU/mL — ABNORMAL LOW (ref 0.30–0.70)
Heparin Unfractionated: 0.1 IU/mL — ABNORMAL LOW (ref 0.30–0.70)

## 2016-02-10 LAB — MAGNESIUM: MAGNESIUM: 2.3 mg/dL (ref 1.7–2.4)

## 2016-02-10 LAB — CBC
HCT: 25 % — ABNORMAL LOW (ref 36.0–46.0)
Hemoglobin: 8.1 g/dL — ABNORMAL LOW (ref 12.0–15.0)
MCH: 29.8 pg (ref 26.0–34.0)
MCHC: 32.4 g/dL (ref 30.0–36.0)
MCV: 91.9 fL (ref 78.0–100.0)
PLATELETS: 90 10*3/uL — AB (ref 150–400)
RBC: 2.72 MIL/uL — AB (ref 3.87–5.11)
RDW: 16.1 % — AB (ref 11.5–15.5)
WBC: 6 10*3/uL (ref 4.0–10.5)

## 2016-02-10 LAB — CK: CK TOTAL: 15006 U/L — AB (ref 38–234)

## 2016-02-10 LAB — PREPARE RBC (CROSSMATCH)

## 2016-02-10 MED ORDER — HEPARIN SODIUM (PORCINE) 1000 UNIT/ML DIALYSIS
1000.0000 [IU] | INTRAMUSCULAR | Status: DC | PRN
  Administered 2016-02-10: 2400 [IU] via INTRAVENOUS_CENTRAL
  Filled 2016-02-10 (×3): qty 6

## 2016-02-10 MED ORDER — ATROPINE SULFATE 1 MG/10ML IJ SOSY
PREFILLED_SYRINGE | INTRAMUSCULAR | Status: AC
  Administered 2016-02-10: 0.5 mg
  Filled 2016-02-10: qty 10

## 2016-02-10 MED ORDER — SODIUM CHLORIDE 0.9 % IV SOLN
Freq: Once | INTRAVENOUS | Status: AC
  Administered 2016-02-10: 12:00:00 via INTRAVENOUS

## 2016-02-10 MED ORDER — FUROSEMIDE 10 MG/ML IJ SOLN
20.0000 mg | Freq: Once | INTRAMUSCULAR | Status: AC
  Administered 2016-02-10: 20 mg via INTRAVENOUS
  Filled 2016-02-10: qty 2

## 2016-02-10 MED ORDER — HEPARIN (PORCINE) IN NACL 100-0.45 UNIT/ML-% IJ SOLN
1600.0000 [IU]/h | INTRAMUSCULAR | Status: DC
  Administered 2016-02-11: 1300 [IU]/h via INTRAVENOUS
  Administered 2016-02-12: 1500 [IU]/h via INTRAVENOUS
  Administered 2016-02-12 – 2016-02-14 (×2): 1600 [IU]/h via INTRAVENOUS
  Filled 2016-02-10 (×6): qty 250

## 2016-02-10 MED ORDER — SODIUM CHLORIDE 0.9 % IV SOLN
INTRAVENOUS | Status: DC
  Administered 2016-02-10: 17:00:00 via INTRAVENOUS

## 2016-02-10 NOTE — Progress Notes (Signed)
Subjective:  Had right IJ placed 4/28.  Patient pleasant this am.  Denies any concerns.  Objective Vital signs in last 24 hours: Filed Vitals:   02/10/16 0400 02/10/16 0413 02/10/16 0500 02/10/16 0600  BP: 87/55  88/63 84/56  Pulse: 41  111   Temp:  97 F (36.1 C)    TempSrc:  Axillary    Resp: Height:      Weight:   211 lb 6.7 oz (95.9 kg)   SpO2: 97%  95%    Weight change:   Intake/Output Summary (Last 24 hours) at 02/10/16 0803 Last data filed at 02/10/16 0700  Gross per 24 hour  Intake 2456.92 ml  Output    382 ml  Net 2074.92 ml   Labs: Basic Metabolic Panel:  Recent Labs Lab 02/08/16 1826 02/09/16 0618 02/10/16 0007  NA 134* 134* 135  K 4.8 4.4 4.1  CL 102 99* 98*  CO2 18* 19* 25  GLUCOSE 115* 108* 116*  BUN 29* 33* 34*  CREATININE 4.99* 5.66* 6.24*  CALCIUM 7.5* 7.6* 7.4*  PHOS  --   --  5.8*   Liver Function Tests:  Recent Labs Lab 02/07/16 2059 02/09/16 1206 02/10/16 0007  AST 395* 340*  --   ALT 96* 97*  --   ALKPHOS 34* 40  --   BILITOT 0.5 1.4*  --   PROT 4.1* 4.6*  --   ALBUMIN 2.4* 2.5* 2.2*   No results for input(s): LIPASE, AMYLASE in the last 168 hours. No results for input(s): AMMONIA in the last 168 hours. CBC:  Recent Labs Lab 02/07/16 2059 02/08/16 0250 02/08/16 1110 02/09/16 0618 02/09/16 1705 02/10/16 0309  WBC 7.9 8.1 9.3 7.2  --  6.0  HGB 7.6* 8.3* 9.3* 8.9* 8.6* 8.1*  HCT 23.1* 25.9* 28.6* 26.8* 26.2* 25.0*  MCV 93.1 92.2 90.8 90.8  --  91.9  PLT 113* 119* 112* 100* 95* 90*   Cardiac Enzymes:  Recent Labs Lab 02/08/16 2336 02/09/16 0618 02/09/16 1206 02/09/16 1705 02/10/16 0308  CKTOTAL  --   --  16109*  --   --   TROPONINI 0.55* 0.46* 0.36* 0.42* 0.30*   CBG:  Recent Labs Lab 01/24/2016 1750  GLUCAP 194*    Iron Studies: No results for input(s): IRON, TIBC, TRANSFERRIN, FERRITIN in the last 72 hours. Studies/Results: Dg Chest Port 1 View  02/09/2016  CLINICAL DATA:  82 year old female  with history of central line placement. EXAM: PORTABLE CHEST 1 VIEW COMPARISON:  Chest x-ray 02/07/2016. FINDINGS: There is a right-sided internal jugular central venous catheter with tip terminating in the proximal superior vena cava. No pneumothorax. Lung volumes are low. Multifocal airspace disease, most evident in the lung bases bilaterally, in addition to the right upper lobe. Probable small bilateral pleural effusions. Cephalization of the pulmonary vasculature. Heart size is mildly enlarged. The patient is rotated to the left on today's exam, resulting in distortion of the mediastinal contours and reduced diagnostic sensitivity and specificity for mediastinal pathology. Atherosclerosis in the thoracic aorta. Post vertebroplasty changes are noted in a mid thoracic vertebral body. IMPRESSION: 1. Findings are favored to reflect multilobar pneumonia. 2. There may be a component of mild interstitial pulmonary edema given the presence of cardiomegaly and cephalization of the pulmonary vasculature, however, findings are too asymmetric to be explained by edema alone. 3. Small bilateral pleural effusions. 4. Atherosclerosis. Electronically Signed   By: Trudie Reed M.D.   On: 02/09/2016 23:31  Medications: Infusions: . amiodarone Stopped (02/10/16 0405)  . dextrose 5 % 1,000 mL with sodium acetate 150 mEq infusion 100 mL/hr at 02/10/16 0306  . heparin Stopped (02/09/16 1634)    Scheduled Medications: . Chlorhexidine Gluconate Cloth  6 each Topical Q0600  . [START ON 02/12/2016] cyanocobalamin  2,000 mcg Intramuscular Q30 days  . docusate sodium  100 mg Oral Daily  . donepezil  10 mg Oral QHS  . ferrous sulfate  325 mg Oral Q breakfast  . levothyroxine  75 mcg Oral QAC breakfast  . lidocaine  10 mL Intradermal Once  . mupirocin ointment  1 application Nasal BID  . pantoprazole  40 mg Oral Daily  . piperacillin-tazobactam (ZOSYN)  IV  2.25 g Intravenous Q8H  . sodium chloride  250 mL Intravenous  Once    have reviewed scheduled and prn medications.  Physical Exam: General: awake, alert, NAD, pleasant Heart: RRR, no murmurs Lungs: CTAB, 2L [place] in place Abdomen: obese, soft, NT/ND, +BS Extremities: RLE with +1 edema to ankle (calf wrapped with drain in place), LLE with minimal edema Neuro: AOx1 (self only), cooperates with exam this am. Dialysis Access:  R IJ    Assessment/ Plan: Valerie Cordova is a 82 y.o. female that was admitted on 4/25 for RLE ischemia and new onset Afib with RVR. PMH dementia. She is s/p R femoral embolectomy, vein patch angioplasty and 4 compartment fasciotomy. Baseline renal function unknown.   1.Renal- AKI in the setting of hypotension/ rhabdomyolysis/ decreased renal perfusion. Likely has renal artery stenosis, given atrophic right kidney on renal u/s 4/26. UOP 257cc. Daily RFP.  CK 15006.  Cr 6.24. K 4.1. Phos 5.8. FENa 5.66%. U eosinophils pending. SPEP, UPEP pending. Renal artery doppler inconclusive. Wt 211lb from 183lb on admission. 2. Hypertension/volume - hypotensive 70-80s systolic. Amiodarone held this am. 3. New onset Afib- NSR now. On Heparin gtt. Amiodarone held for hypotension. Management per Cards 4. Anemia - Hgb 8.1 5. Dementia - Aricept per primary 6. Acute combined systolic/diastolic CHF 7. Hypothyroidism  Cr worsening 5.66>6.26 today.  CK 15006 this am.  Still oliguric. RIJ HD catheter placed 4/28.  Electrolytes currently stable from previous.  May need to consider dialysis.  [name], DO  02/10/2016,8:03 AM  LOS: 4 days     I have seen and examined this patient and agree with plan as outlined by Dr. Nadine CountsGottschalk.  CK's improving but still no UOP.  Will continue to follow closely and cont with IVF's.  Hopefully we can avoid HD, however will need to speak with her daughter [name](HCPOA). Valerie NordmannJoseph A Kento Gossman,MD 02/10/2016 3:38 PM

## 2016-02-10 NOTE — Progress Notes (Signed)
PULMONARY / CRITICAL CARE MEDICINE   Name: Valerie Cordova MRN: [id number] DOB: 03-19-1932    ADMISSION DATE:  Feb 12, 2016 CONSULTATION DATE:  02/07/2016  REFERRING MD:  Dr. Susie Cassette  CHIEF COMPLAINT:  Oliguric, hypotensive  BRIEF:   82 y/o female with dementia admitted with critical limb ischemia requiring emergent R femoral artery embolectomy.  Developed AKI.  Extubated 4/28.   SUBJECTIVE:  HD cath placed  VITAL SIGNS: BP 93/66 mmHg  Pulse 92  Temp(Src) 97.5 F (36.4 C) (Axillary)  Resp 15  Ht  (1.575 m)  Wt 95.9 kg (211 lb 6.7 oz)  BMI 38.66 kg/m2  SpO2 96%  HEMODYNAMICS:    VENTILATOR SETTINGS:    INTAKE / OUTPUT: I/O last 3 completed shifts: In: 3736.5 [P.O.:210; I.V.:3276.5; IV Piggyback:250] Out: 487 [Urine:337; Drains:150]  PHYSICAL EXAMINATION: General: comfortable HENT: NCAT OP clear PULM: CTA B CV: RRR, no mgr GI: BS+, soft, nontender MSK: normal bulk and tone Derm: cool foot with some bruising on R Neuro: awake and alert, pleasantly confused   LABS:  BMET  Recent Labs Lab 02/08/16 1826 02/09/16 0618 02/10/16 0007  NA 134* 134* 135  K 4.8 4.4 4.1  CL 102 99* 98*  CO2 18* 19* 25  BUN 29* 33* 34*  CREATININE 4.99* 5.66* 6.24*  GLUCOSE 115* 108* 116*    Electrolytes  Recent Labs Lab 02/07/16 2059 02/08/16 0245  02/08/16 1826 02/09/16 0618 02/10/16 0007 02/10/16 0529  CALCIUM 5.6* 7.4*  < > 7.5* 7.6* 7.4*  --   MG 1.2* 2.1  --   --   --   --  2.3  PHOS  --   --   --   --   --  5.8*  --   < > = values in this interval not displayed.  CBC  Recent Labs Lab 02/08/16 1110 02/09/16 0618 02/09/16 1705 02/10/16 0309  WBC 9.3 7.2  --  6.0  HGB 9.3* 8.9* 8.6* 8.1*  HCT 28.6* 26.8* 26.2* 25.0*  PLT 112* 100* 95* 90*    Coag's  Recent Labs Lab Feb 12, 2016 1236 02/07/16 1848 02/09/16 1705  APTT  --  73* 58*  INR 1.19 1.43 1.27    Sepsis Markers  Recent Labs Lab 02/07/16 1848 02/07/16 2059  LATICACIDVEN 1.8 1.7   PROCALCITON 1.18  --     ABG  Recent Labs Lab 02/07/16 2226  PHART 7.397  PCO2ART 32.0*  PO2ART 79.9*    Liver Enzymes  Recent Labs Lab 02/07/16 2059 02/09/16 1206 02/10/16 0007  AST 395* 340*  --   ALT 96* 97*  --   ALKPHOS 34* 40  --   BILITOT 0.5 1.4*  --   ALBUMIN 2.4* 2.5* 2.2*    Cardiac Enzymes  Recent Labs Lab 02/09/16 1206 02/09/16 1705 02/10/16 0308  TROPONINI 0.36* 0.42* 0.30*    Glucose  Recent Labs Lab Feb 12, 2016 1750  GLUCAP 194*    Imaging  4/28 CXR > multi-lobar pneumonia, images reviewed  STUDIES:  4/25 CXR > Cardiomegaly with pulmonary vascular congestion and likely mild interstitial edema 4/26 Renal ultrasound > rt renal atrophy 4/26 CT abdomen w/o contrast > no RP bleed, rt femoral pseudoaneurysm 27 mm Echo -EF 40%, WMA +, gr 2 DD  CULTURES: 4/26 Blood cx > 4/25 Urine cx > 6,000 colonies/mL insignificant growth   ANTIBIOTICS: Vancomycin 4/26 >4/28 Zosyn 4/26 > TMP-SMX 4/25 > 4/25  SIGNIFICANT EVENTS: 4/25 Admit with critical limb ischemia, taken to OR with Dr. Edilia Bo.  Also  found to have atrial fibrillation with RVR so cardiology was consulted 4/26 [facility] consulted.  Worsening renal function and some hypotension prompting PCCM consult  4/28 extubated  LINES/TUBES: Foley 4/25 PIV x 3 4/25 Wound Vac 4/25 R IJ HD cath 4/29 >    DISCUSSION: 82 y/o female with dementia but no prior cardiac history admitted for acute critical limb ischemia secondary to acute right LE arterial occlusion. Found to be in new onset atrial fibrillation. She was taken to OR emergently by vascular surgery for right femoral embolectomy per Dr. Edilia Boickson On 4/25. Cardiology consulted for new onset atrial fibrillation. Hospitalist consulted for medical management. Patient found to have systolic blood pressures in the 80s over the last 24 hours with oliguric renal failure  ASSESSMENT / PLAN:  PULMONARY A: Acute hypoxic resp failure due to pulmonary  edema > improving P:   - Supplemental oxygen as needed  CARDIOVASCULAR A:  New Onset Atrial Fibrillation w/ RVR - management per cardiology Hypotension Elevated troponin Critical Limb Ischemia s/p right femoral embolectomy Non-sustained V-tach, WCT - on amio gtt Acute CHF - likely more diastolic.  Echo results as above P:  Amio  gtt and heparin gtt per cardiology, BB stopped due to brady  RENAL A:   AKI  Hypocalcemia Hypomagnesium AG Metabolic Acidosis - better P:   HD decision per renal Monitor BMET and UOP Replace electrolytes as needed   GASTROINTESTINAL A:   No acute issues P:   Clear liquid diet Continue PPI  HEMATOLOGIC A:   DVT PPx Acute blood loss anemia - Hb drop from 12 to 8, no RP bleed P:  Monitor for bleeding  INFECTIOUS A:   UTI? Culture negative  P:   D/C zosyn Monitor  ENDOCRINE A:   Hypothyroidism   P:   - continue synthroid  NEUROLOGIC A:   Hx of Alzheimers Hx of Depression P:   - Aricept QHS - Celexa daily   FAMILY  - Updates:daughter at bedside 4/27, no one here on 4/29  - Inter-disciplinary family meet or Palliative Care meeting due by:  day 7   The patient is critically ill with multiple organ systems failure and requires high complexity decision making for assessment and support, frequent evaluation and titration of therapies, application of advanced monitoring technologies and extensive interpretation of multiple databases. Critical Care Time devoted to patient care services described in this note independent of APP time is 34 minutes.   Heber CarolinaBrent Breland Trouten, MD  PCCM Pager: [phone] Cell: [phone] After 3pm or if no response, call [phone]   02/10/2016     02/10/2016, 10:57 AM

## 2016-02-10 NOTE — Progress Notes (Signed)
ANTICOAGULATION CONSULT NOTE   Pharmacy Consult for heparin Indication: new onset atrial fibrillation with acute embolus to right lower extremity  Allergies  Allergen Reactions  . Codeine Itching and Nausea And Vomiting  . Vicodin [Hydrocodone-Acetaminophen] Itching and Nausea And Vomiting    Patient Measurements: Height: 5\' 2"  (157.5 cm) Weight: 211 lb 6.7 oz (95.9 kg) IBW/kg (Calculated) : 50.1 Heparin Dosing Weight: ~69 kg  Vital Signs: Temp: 97.6 F (36.4 C) (04/29 1200) Temp Source: Oral (04/29 1200) BP: 96/47 mmHg (04/29 1200) Pulse Rate: 98 (04/29 1144)  Labs:  Recent Labs  02/07/16 1848  02/08/16 1826  02/09/16 0618 02/09/16 1206 02/09/16 1705 02/10/16 0007 02/10/16 0308 02/10/16 0309 02/10/16 1000  HGB  --   < >  --   --  8.9*  --  8.6*  --   --  8.1*  --   HCT  --   < >  --   --  26.8*  --  26.2*  --   --  25.0*  --   PLT  --   < >  --   --  100*  --  95*  --   --  90*  --   APTT 73*  --   --   --   --   --  58*  --   --   --   --   LABPROT 17.5*  --   --   --   --   --  16.0*  --   --   --   --   INR 1.43  --   --   --   --   --  1.27  --   --   --   --   HEPARINUNFRC  --   < >  --   < > <0.10*  --  <0.10*  --   --   --  0.10*  CREATININE  --   < > 4.99*  --  5.66*  --   --  6.24*  --   --   --   CKTOTAL  --   --   --   --   --  20406*  --   --   --   --   --   TROPONINI  --   < > 0.61*  < > 0.46* 0.36* 0.42*  --  0.30*  --   --   < > = values in this interval not displayed.  Estimated Creatinine Clearance: 7.6 mL/min (by C-G formula based on Cr of 6.24).   Assessment: [age] yof with new onset atrial fibrillation with acute embolus to right lower extremity. Initially started on heparin on 4/25 that was stopped due R femoral embolectomy and 4 compartment fasciotomy. Pharmacy has been managing heparin for afib. Plans noted for coumadin but holding for now in case HD access needed. -heparin level is below goal (HL= 0.1) but heparin was off (line placement  4/28) -hg= 8.1 (trend down), plt= 90 (trend down)   Spoke with Dr. Kendrick FriesMcQuaid: ok to resume heparin   Platelet drop noted; doubt HIT -Heparin started 4/25 (day 5) -platelet count noted 188 on 4/25 (> 50% drop). However the initial platelet drop started 4/26. -other probable causes (recent surgery, antibiotics, liver function with elevated LFTs)  Goal of Therapy:  Heparin level 0.3-0.7 units/ml Monitor platelets by anticoagulation protocol: Yes   Plan:  -Restart heparin at 1100 units/hr -Will shoot towards low end of goal with platelet trend -  Heparin level in 8 hours and daily wth CBC daily  Harland German, Ilda Basset D 02/10/2016 12:26 PM

## 2016-02-10 NOTE — Progress Notes (Addendum)
   Daily Progress Note  Assessment/Planning: POD #4 s/p R fem TE, VPA, 4-comp fasciotomy   Neuro: pain control ok  Pulm: increasing oxygen need, will check pCXR  CV: hypotension all yesterday, bradycardiac; amio drip held due to bradycardia into 30s, Cardiology on board, will defer to the  GI: no issue  FEN: on MIVF D5 1/2 @ 100 cc/hr  REN: oliguric with only 257 cc/24 hour, Nephrology on board, CVVHD planned  HEME/ID: anemic, 8.1/25.0, may need to transfuse but concern with fluid overload   Subjective  - 4 Days Post-Op  No complaints  Objective Filed Vitals:   02/10/16 0400 02/10/16 0413 02/10/16 0500 02/10/16 0600  BP: 87/55  88/63 84/56  Pulse: 41  111   Temp:  97 F (36.1 C)    TempSrc:  Axillary    Resp: 13  13 13   Height:      Weight:   211 lb 6.7 oz (95.9 kg)   SpO2: 97%  95%     Intake/Output Summary (Last 24 hours) at 02/10/16 0752 Last data filed at 02/10/16 0700  Gross per 24 hour  Intake 2532.12 ml  Output    382 ml  Net 2150.12 ml    PULM  BLL rales CV  RRR GI  soft, NTND VASC  R groin inc c/d/i, R calf bandaged with VAC in place  Laboratory CBC    Component Value Date/Time   WBC 6.0 02/10/2016 0309   HGB 8.1* 02/10/2016 0309   HCT 25.0* 02/10/2016 0309   PLT 90* 02/10/2016 0309    BMET    Component Value Date/Time   NA 135 02/10/2016 0007   K 4.1 02/10/2016 0007   CL 98* 02/10/2016 0007   CO2 25 02/10/2016 0007   GLUCOSE 116* 02/10/2016 0007   BUN 34* 02/10/2016 0007   CREATININE 6.24* 02/10/2016 0007   CALCIUM 7.4* 02/10/2016 0007   GFRNONAA 6* 02/10/2016 0007   GFRAA 7* 02/10/2016 0007    Leonides SakeBrian Neeti Knudtson, MD [facility] [place] Office: [phone] Pager: [phone]  02/10/2016, 7:52 AM   Addendum  Transfuse 2 unit pRBC to see if volume helps patient's BP.  Lasix 20 mg IV between units.  Leonides SakeBrian Heber Hoog, MD [facility] [place] Office: [phone] Pager:  [phone]  02/10/2016, 10:50 AM

## 2016-02-10 NOTE — Progress Notes (Signed)
eLink Physician-Brief Progress Note Patient Name: Valerie NettlesBetty Cordova DOB: 12/10/1931 MRN: [id number]   Date of Service  02/10/2016  HPI/Events of Note  Drop in HR to the 30s on amio gtt.  BP soft at 87/55 (66)  eICU Interventions  Plan: Hold amio gtt for now Monitor HR     Intervention Category Major Interventions: Arrhythmia - evaluation and management  DETERDING,ELIZABETH 02/10/2016, 4:08 AM

## 2016-02-10 NOTE — Progress Notes (Deleted)
ANTICOAGULATION CONSULT NOTE   Pharmacy Consult for heparin Indication: new onset atrial fibrillation with acute embolus to right lower extremity  Allergies  Allergen Reactions  . Codeine Itching and Nausea And Vomiting  . Vicodin [Hydrocodone-Acetaminophen] Itching and Nausea And Vomiting    Patient Measurements: Height: 5\' 2"  (157.5 cm) Weight: 211 lb 6.7 oz (95.9 kg) IBW/kg (Calculated) : 50.1 Heparin Dosing Weight: ~69 kg  Vital Signs: Temp: 98 F (36.7 C) (04/29 1144) Temp Source: Oral (04/29 1144) BP: 97/70 mmHg (04/29 1145) Pulse Rate: 98 (04/29 1144)  Labs:  Recent Labs  02/07/16 1848  02/08/16 1826  02/09/16 0618 02/09/16 1206 02/09/16 1705 02/10/16 0007 02/10/16 0308 02/10/16 0309 02/10/16 1000  HGB  --   < >  --   --  8.9*  --  8.6*  --   --  8.1*  --   HCT  --   < >  --   --  26.8*  --  26.2*  --   --  25.0*  --   PLT  --   < >  --   --  100*  --  95*  --   --  90*  --   APTT 73*  --   --   --   --   --  58*  --   --   --   --   LABPROT 17.5*  --   --   --   --   --  16.0*  --   --   --   --   INR 1.43  --   --   --   --   --  1.27  --   --   --   --   HEPARINUNFRC  --   < >  --   < > <0.10*  --  <0.10*  --   --   --  0.10*  CREATININE  --   < > 4.99*  --  5.66*  --   --  6.24*  --   --   --   CKTOTAL  --   --   --   --   --  20406*  --   --   --   --   --   TROPONINI  --   < > 0.61*  < > 0.46* 0.36* 0.42*  --  0.30*  --   --   < > = values in this interval not displayed.  Estimated Creatinine Clearance: 7.6 mL/min (by C-G formula based on Cr of 6.24).   Assessment: [age] yof with new onset atrial fibrillation with acute embolus to right lower extremity. Initially started on heparin on 4/25 that was stopped due R femoral embolectomy and 4 compartment fasciotomy. Pharmacy has been managing heparin for afib. Plans noted for coumadin but holding for now in case HD access needed. -heparin level is below goal (HL= 0.1) -hg= 8.1 (trend down), plt= 90 (trend  down)  Platelet drop noted; doubt HIT -Heparin started 4/25 (day 5) -platelet count noted 188 on 4/25 (> 50% drop). However the initial platelet drop started 4/26. -other probable causes (recent surgery, antibiotics, liver function with elevated LFTs)  Goal of Therapy:  Heparin level 0.3-0.7 units/ml Monitor platelets by anticoagulation protocol: Yes   Plan:  -Increase heparin to 1150 units/hr -Will shoot towards low end of goal with platelet trend -Heparin level in 8 hours and daily wth CBC daily  Harland Germanndrew Rasheen Schewe, Pharm D 02/10/2016 11:51 AM

## 2016-02-11 ENCOUNTER — Encounter (HOSPITAL_COMMUNITY): Payer: Self-pay

## 2016-02-11 ENCOUNTER — Inpatient Hospital Stay (HOSPITAL_COMMUNITY): Payer: Medicare (Managed Care)

## 2016-02-11 LAB — RENAL FUNCTION PANEL
ANION GAP: 12 (ref 5–15)
Albumin: 2.2 g/dL — ABNORMAL LOW (ref 3.5–5.0)
BUN: 37 mg/dL — ABNORMAL HIGH (ref 6–20)
CALCIUM: 7.8 mg/dL — AB (ref 8.9–10.3)
CHLORIDE: 94 mmol/L — AB (ref 101–111)
CO2: 29 mmol/L (ref 22–32)
Creatinine, Ser: 6.56 mg/dL — ABNORMAL HIGH (ref 0.44–1.00)
GFR, EST AFRICAN AMERICAN: 6 mL/min — AB (ref >60)
GFR, EST NON AFRICAN AMERICAN: 5 mL/min — AB (ref >60)
Glucose, Bld: 107 mg/dL — ABNORMAL HIGH (ref 65–99)
Phosphorus: 5.5 mg/dL — ABNORMAL HIGH (ref 2.5–4.6)
Potassium: 3.6 mmol/L (ref 3.5–5.1)
Sodium: 135 mmol/L (ref 135–145)

## 2016-02-11 LAB — TYPE AND SCREEN
ABO/RH(D): B POS
Antibody Screen: NEGATIVE
UNIT DIVISION: 0
Unit division: 0
Unit division: 0

## 2016-02-11 LAB — CBC
HCT: 30.4 % — ABNORMAL LOW (ref 36.0–46.0)
HEMOGLOBIN: 10.6 g/dL — AB (ref 12.0–15.0)
MCH: 31.4 pg (ref 26.0–34.0)
MCHC: 34.9 g/dL (ref 30.0–36.0)
MCV: 89.9 fL (ref 78.0–100.0)
PLATELETS: 100 10*3/uL — AB (ref 150–400)
RBC: 3.38 MIL/uL — AB (ref 3.87–5.11)
RDW: 15.7 % — ABNORMAL HIGH (ref 11.5–15.5)
WBC: 6.2 10*3/uL (ref 4.0–10.5)

## 2016-02-11 LAB — CK: Total CK: 8137 U/L — ABNORMAL HIGH (ref 38–234)

## 2016-02-11 LAB — TROPONIN I
Troponin I: 0.19 ng/mL — ABNORMAL HIGH (ref <0.031)
Troponin I: 0.18 ng/mL — ABNORMAL HIGH (ref <0.031)

## 2016-02-11 LAB — HEPARIN LEVEL (UNFRACTIONATED)
HEPARIN UNFRACTIONATED: 0.37 [IU]/mL (ref 0.30–0.70)
HEPARIN UNFRACTIONATED: 0.19 [IU]/mL — AB (ref 0.30–0.70)

## 2016-02-11 MED ORDER — AMIODARONE HCL 200 MG PO TABS
400.0000 mg | ORAL_TABLET | Freq: Two times a day (BID) | ORAL | Status: DC
  Administered 2016-02-11 – 2016-02-14 (×7): 400 mg via ORAL
  Filled 2016-02-11 (×8): qty 2

## 2016-02-11 NOTE — Progress Notes (Signed)
PULMONARY / CRITICAL CARE MEDICINE   Name: Valerie Cordova MRN: [id number] DOB: 1931/12/25    ADMISSION DATE:  02/03/2016 CONSULTATION DATE:  02/07/2016  REFERRING MD:  Dr. Susie Cassette  CHIEF COMPLAINT:  Oliguric, hypotensive  BRIEF:   82 y/o female with dementia admitted with critical limb ischemia requiring emergent R femoral artery embolectomy.  Developed AKI.  Extubated 4/28.   SUBJECTIVE:  Urine output 710cc yesterday No acute events  VITAL SIGNS: BP 133/80 mmHg  Pulse 96  Temp(Src) 98.7 F (37.1 C) (Tympanic)  Resp 20  Ht  (1.575 m)  Wt 95.9 kg (211 lb 6.7 oz)  BMI 38.66 kg/m2  SpO2 94%  HEMODYNAMICS:    VENTILATOR SETTINGS:    INTAKE / OUTPUT: I/O last 3 completed shifts: In: 5664.2 [P.O.:30; I.V.:4829.2; Blood:705; IV Piggyback:100] Out: 1305 [Urine:905; Drains:400]  PHYSICAL EXAMINATION: General: comfortable in chair HENT: NCAT OP clear PULM: CTA B CV: RRR, no mgr GI: BS+, soft, nontender MSK: normal bulk and tone Derm: cool foot with some bruising on R, dressing and wound vac in place Neuro: awake and alert, pleasantly confused   LABS:  BMET  Recent Labs Lab 02/09/16 0618 02/10/16 0007 02/11/16 0605  NA 134* 135 135  K 4.4 4.1 3.6  CL 99* 98* 94*  CO2 19* 25 29  BUN 33* 34* 37*  CREATININE 5.66* 6.24* 6.56*  GLUCOSE 108* 116* 107*    Electrolytes  Recent Labs Lab 02/07/16 2059 02/08/16 0245  02/09/16 0618 02/10/16 0007 02/10/16 0529 02/11/16 0605  CALCIUM 5.6* 7.4*  < > 7.6* 7.4*  --  7.8*  MG 1.2* 2.1  --   --   --  2.3  --   PHOS  --   --   --   --  5.8*  --  5.5*  < > = values in this interval not displayed.  CBC  Recent Labs Lab 02/09/16 0618 02/09/16 1705 02/10/16 0309 02/11/16 0605  WBC 7.2  --  6.0 6.2  HGB 8.9* 8.6* 8.1* 10.6*  HCT 26.8* 26.2* 25.0* 30.4*  PLT 100* 95* 90* 100*    Coag's  Recent Labs Lab 01/30/2016 1236 02/07/16 1848 02/09/16 1705  APTT  --  73* 58*  INR 1.19 1.43 1.27    Sepsis  Markers  Recent Labs Lab 02/07/16 1848 02/07/16 2059  LATICACIDVEN 1.8 1.7  PROCALCITON 1.18  --     ABG  Recent Labs Lab 02/07/16 2226  PHART 7.397  PCO2ART 32.0*  PO2ART 79.9*    Liver Enzymes  Recent Labs Lab 02/07/16 2059 02/09/16 1206 02/10/16 0007 02/11/16 0605  AST 395* 340*  --   --   ALT 96* 97*  --   --   ALKPHOS 34* 40  --   --   BILITOT 0.5 1.4*  --   --   ALBUMIN 2.4* 2.5* 2.2* 2.2*    Cardiac Enzymes  Recent Labs Lab 02/10/16 2128 02/11/16 0605 02/11/16 1126  TROPONINI 0.23* 0.19* 0.18*    Glucose  Recent Labs Lab 01/27/2016 1750  GLUCAP 194*    Imaging  4/30 CXR > multi-lobar airspace disease, images reviewed  STUDIES:  4/25 CXR > Cardiomegaly with pulmonary vascular congestion and likely mild interstitial edema 4/26 Renal ultrasound > rt renal atrophy 4/26 CT abdomen w/o contrast > no RP bleed, rt femoral pseudoaneurysm 27 mm Echo -EF 40%, WMA +, gr 2 DD  CULTURES: 4/26 Blood cx > 4/25 Urine cx > 6,000 colonies/mL insignificant growth   ANTIBIOTICS:  Vancomycin 4/26 >4/28 Zosyn 4/26 > 4/28 TMP-SMX 4/25 > 4/25  SIGNIFICANT EVENTS: 4/25 Admit with critical limb ischemia, taken to OR with Dr. Edilia Boickson.  Also found to have atrial fibrillation with RVR so cardiology was consulted 4/26 [facility] consulted.  Worsening renal function and some hypotension prompting PCCM consult  4/28 extubated  LINES/TUBES: Foley 4/25 PIV x 3 4/25 Wound Vac 4/25 R IJ HD cath 4/29 >    DISCUSSION: 82 y/o female with dementia but no prior cardiac history admitted for acute critical limb ischemia secondary to acute right LE arterial occlusion. Found to be in new onset atrial fibrillation. She was taken to OR emergently by vascular surgery for right femoral embolectomy per Dr. Edilia Boickson On 4/25. Cardiology consulted for new onset atrial fibrillation. Renal failure developed post operatively but urine out put has improved as of 4/30.  ASSESSMENT /  PLAN:  PULMONARY A: Abnormal CXR > uncertain significance as doesn't appear to have pneumonia clinically P:   Monitor O2 saturation Will need f/u CXR after hospital discharge to ensure these findings clear up  CARDIOVASCULAR A:  New Onset Atrial Fibrillation w/ RVR - management per cardiology Hypotension Elevated troponin Critical Limb Ischemia s/p right femoral embolectomy Non-sustained V-tach, WCT - on amio gtt Acute CHF - likely more diastolic.  Echo results as above P:  Amio gtt and heparin gtt per cardiology Peripheral vascular disease management per vascular surgery  RENAL A:   AKI  Hypocalcemia Hypomagnesium AG Metabolic Acidosis - better P:   HD decision per renal Monitor BMET and UOP Replace electrolytes as needed   GASTROINTESTINAL A:   No acute issues P:   Advance diet as tolerated Continue PPI  HEMATOLOGIC A:   DVT PPx Acute blood loss anemia - Hb drop from 12 to 8, no RP bleed P:  Monitor for bleeding  INFECTIOUS A:   UTI? Culture negative  P:   Monitor off of antibiotics  ENDOCRINE A:   Hypothyroidism   P:   - continue synthroid  NEUROLOGIC A:   Hx of Alzheimers Hx of Depression P:   Aricept QHS Celexa daily   FAMILY  - Updates:daughter at bedside 4/27, no one here on 4/30  - Inter-disciplinary family meet or Palliative Care meeting due by:  day 7  Could move to SDU if OK by vascular  Heber CarolinaBrent Sherryann Frese, MD [facility] Pager: [phone] Cell: [phone] After 3pm or if no response, call [phone]   02/11/2016     02/11/2016, 2:42 PM

## 2016-02-11 NOTE — Progress Notes (Addendum)
ANTICOAGULATION CONSULT NOTE   Pharmacy Consult for heparin Indication: new onset atrial fibrillation with acute embolus to right lower extremity  Allergies  Allergen Reactions  . Codeine Itching and Nausea And Vomiting  . Vicodin [Hydrocodone-Acetaminophen] Itching and Nausea And Vomiting    Patient Measurements: Height: 5\' 2"  (157.5 cm) Weight: 211 lb 6.7 oz (95.9 kg) IBW/kg (Calculated) : 50.1 Heparin Dosing Weight: ~69 kg  Vital Signs: Temp: 98.3 F (36.8 C) (04/30 0800) Temp Source: Tympanic (04/30 0800) BP: 95/78 mmHg (04/30 1100) Pulse Rate: 103 (04/30 1100)  Labs:  Recent Labs  02/09/16 0618 02/09/16 1206 02/09/16 1705 02/10/16 0007  02/10/16 0309 02/10/16 1000 02/10/16 1130 02/10/16 2120 02/10/16 2128 02/11/16 0605 02/11/16 0946  HGB 8.9*  --  8.6*  --   --  8.1*  --   --   --   --  10.6*  --   HCT 26.8*  --  26.2*  --   --  25.0*  --   --   --   --  30.4*  --   PLT 100*  --  95*  --   --  90*  --   --   --   --  100*  --   APTT  --   --  58*  --   --   --   --   --   --   --   --   --   LABPROT  --   --  16.0*  --   --   --   --   --   --   --   --   --   INR  --   --  1.27  --   --   --   --   --   --   --   --   --   HEPARINUNFRC <0.10*  --  <0.10*  --   --   --  0.10*  --  0.10*  --   --  0.37  CREATININE 5.66*  --   --  6.24*  --   --   --   --   --   --  6.56*  --   CKTOTAL  --  20406*  --   --   --   --   --  15006*  --   --  8137*  --   TROPONINI 0.46* 0.36* 0.42*  --   < >  --   --  0.28*  --  0.23* 0.19*  --   < > = values in this interval not displayed.  Estimated Creatinine Clearance: 7.3 mL/min (by C-G formula based on Cr of 6.56).   Assessment: [age] yof with new onset atrial fibrillation with acute embolus to right lower extremity. Initially started on heparin on 4/25 that was stopped due R femoral embolectomy and 4 compartment fasciotomy. Pharmacy has been managing heparin for afib. Plans noted for coumadin but holding for now. -heparin  level at goal (HL= 0.37)  -hg= 10.6 (s/p PRBC), plt= 100 (up from 90 on 4/29)    Goal of Therapy:  Heparin level 0.3-0.7 units/ml Monitor platelets by anticoagulation protocol: Yes   Plan:  -Continue heparin at 1300 units/hr -Will shoot towards low end of goal with platelet trend -Heparin level in 8 hours (to confirm) and daily wth CBC daily -Consider re-starting coumadin as platelets trend up  Harland GermanAndrew Latorsha Curling, Pharm D 02/11/2016 11:38 AM  Addendum -heparin level= 0.18 and  below goal -no line of infusions problems per RN  Plan  -Increase heparin to 1500 units/hr -Heparin level in 8 hours and daily wth CBC daily  Harland German, Pharm D 02/11/2016 7:12 PM

## 2016-02-11 NOTE — Progress Notes (Signed)
Subjective:  No complaints of shortness of breath or chest pain  Objective:  Vital Signs in the last 24 hours: BP 95/78 mmHg  Pulse 103  Temp(Src) 98.3 F (36.8 C) (Tympanic)  Resp 16  Ht 5\' 2"  (1.575 m)  Wt 95.9 kg (211 lb 6.7 oz)  BMI 38.66 kg/m2  SpO2 91%  Physical Exam: Elderly white female sitting at bedside in no acute distress Lungs:  Clear  Cardiac:  iregular rhythm, normal S1 and S2, no S3 Abdomen:  Soft, nontender, no masses Extremities:  Trace edema, right knee wrapped Intake/Output from previous day: 04/29 0701 - 04/30 0700 In: 4212.5 [P.O.:30; I.V.:3477.5; Blood:705] Out: 1025 [Urine:750; Drains:275] Weight Filed Weights   05/07/2016 1333 05/07/2016 2006 02/10/16 0500  Weight: 83.008 kg (183 lb) 87.1 kg (192 lb 0.3 oz) 95.9 kg (211 lb 6.7 oz)    Lab Results: Basic Metabolic Panel:  Recent Labs  78/29/5604/29/17 0007 02/11/16 0605  NA 135 135  K 4.1 3.6  CL 98* 94*  CO2 25 29  GLUCOSE 116* 107*  BUN 34* 37*  CREATININE 6.24* 6.56*    CBC:  Recent Labs  02/10/16 0309 02/11/16 0605  WBC 6.0 6.2  HGB 8.1* 10.6*  HCT 25.0* 30.4*  MCV 91.9 89.9  PLT 90* 100*    BNP    Component Value Date/Time   BNP 424.3* 2016/01/16 1236   Telemetry: Atrial fibrillation, ventricular response currently controlled  Assessment/Plan:  1. Paroxysmal atrial fibrillation-rate currently controlled 2.  Acute on chronic renal failure-approaching dialysis 3.  Peripheral vascular disease 4.  Combined systolic/diastolic heart failure  Recommendations:  Probably will need warfarin long-term depending on dialysis.  Can change amiodarone to oral.      W. Ashley RoyaltySpencer Tilley, Jr.  MD [facility]  02/11/2016, 11:37 AM

## 2016-02-11 NOTE — Progress Notes (Signed)
   Daily Progress Note  Assessment/Planning: POD #5 s/p R fem TE, VPA, 4-comp fasciotomy   Good response to 2 u pRBC  Minimal response to lasix given between units  Back in afib but rate controlled  Cont VAC for now  Nephrology considering HD   Subjective  - 5 Days Post-Op  No complaints  Objective Filed Vitals:   02/11/16 0416 02/11/16 0500 02/11/16 0600 02/11/16 0700  BP:   114/68 96/56  Pulse:    100  Temp: 97.8 F (36.6 C)   98.3 F (36.8 C)  TempSrc: Axillary   Tympanic  Resp:   19 18  Height:      Weight:      SpO2:  93% 91% 90%    Intake/Output Summary (Last 24 hours) at 02/11/16 0859 Last data filed at 02/11/16 0800  Gross per 24 hour  Intake   4215 ml  Output    995 ml  Net   3220 ml   PULM BLL rales CV RRR GI soft, NTND VASC R groin inc c/d/i, R calf bandaged with VAC in place  Laboratory CBC    Component Value Date/Time   WBC 6.2 02/11/2016 0605   HGB 10.6* 02/11/2016 0605   HCT 30.4* 02/11/2016 0605   PLT 100* 02/11/2016 0605    BMET    Component Value Date/Time   NA 135 02/11/2016 0605   K 3.6 02/11/2016 0605   CL 94* 02/11/2016 0605   CO2 29 02/11/2016 0605   GLUCOSE 107* 02/11/2016 0605   BUN 37* 02/11/2016 0605   CREATININE 6.56* 02/11/2016 0605   CALCIUM 7.8* 02/11/2016 0605   GFRNONAA 5* 02/11/2016 0605   GFRAA 6* 02/11/2016 [id number]    Valerie SakeBrian Andon Villard, MD [facility] Office: [phone] Pager: [phone]  02/11/2016, 8:59 AM

## 2016-02-11 NOTE — Progress Notes (Signed)
ANTICOAGULATION CONSULT NOTE - Follow Up Consult  Pharmacy Consult for Heparin  Indication: atrial fibrillation, s/p right femoral embolectomy   Allergies  Allergen Reactions  . Codeine Itching and Nausea And Vomiting  . Vicodin [Hydrocodone-Acetaminophen] Itching and Nausea And Vomiting    Patient Measurements: Height: 5\' 2"  (157.5 cm) Weight: 211 lb 6.7 oz (95.9 kg) IBW/kg (Calculated) : 50.1  Vital Signs: Temp: 98.3 F (36.8 C) (04/29 2000) Temp Source: Axillary (04/29 2000) BP: 111/80 mmHg (04/29 2300) Pulse Rate: 77 (04/29 2300)  Labs:  Recent Labs  02/08/16 1826  02/09/16 0618 02/09/16 1206 02/09/16 1705 02/10/16 0007 02/10/16 0308 02/10/16 0309 02/10/16 1000 02/10/16 1130 02/10/16 2120 02/10/16 2128  HGB  --   --  8.9*  --  8.6*  --   --  8.1*  --   --   --   --   HCT  --   --  26.8*  --  26.2*  --   --  25.0*  --   --   --   --   PLT  --   --  100*  --  95*  --   --  90*  --   --   --   --   APTT  --   --   --   --  58*  --   --   --   --   --   --   --   LABPROT  --   --   --   --  16.0*  --   --   --   --   --   --   --   INR  --   --   --   --  1.27  --   --   --   --   --   --   --   HEPARINUNFRC  --   < > <0.10*  --  <0.10*  --   --   --  0.10*  --  0.10*  --   CREATININE 4.99*  --  5.66*  --   --  6.24*  --   --   --   --   --   --   CKTOTAL  --   --   --  20406*  --   --   --   --   --  15006*  --   --   TROPONINI 0.61*  < > 0.46* 0.36* 0.42*  --  0.30*  --   --  0.28*  --  0.23*  < > = values in this interval not displayed.   Assessment: Heparin level is undetectable s/p re-start of heparin for afib/RLE embolus, Scr still rising, no issues per RN.   Goal of Therapy:  Heparin level 0.3-0.7 units/ml Monitor platelets by anticoagulation protocol: Yes   Plan:  -Increase heparin to 1300 units/hr -0800 HL  Abran DukeLedford, Layan Zalenski 02/11/2016,12:05 AM

## 2016-02-11 NOTE — Progress Notes (Signed)
Subjective:   Patient pleasant but confused this am.  Continued Rt leg pain, otherwise no concerns today.  Objective Vital signs in last 24 hours: Filed Vitals:   02/11/16 1100 02/11/16 1200 02/11/16 1300 02/11/16 1400  BP: 95/78 108/71 122/74 133/80  Pulse: 103 108 109 96  Temp: 98.7 F (37.1 C)     TempSrc:      Resp: 16 20 13 20   Height:      Weight:      SpO2: 90% 92% 92% 94%   Weight change:   Intake/Output Summary (Last 24 hours) at 02/11/16 1431 Last data filed at 02/11/16 1400  Gross per 24 hour  Intake   3904 ml  Output    910 ml  Net   2994 ml   Labs: Basic Metabolic Panel:  Recent Labs Lab 02/09/16 0618 02/10/16 0007 02/11/16 0605  NA 134* 135 135  K 4.4 4.1 3.6  CL 99* 98* 94*  CO2 19* 25 29  GLUCOSE 108* 116* 107*  BUN 33* 34* 37*  CREATININE 5.66* 6.24* 6.56*  CALCIUM 7.6* 7.4* 7.8*  PHOS  --  5.8* 5.5*   Liver Function Tests:  Recent Labs Lab 02/07/16 2059 02/09/16 1206 02/10/16 0007 02/11/16 0605  AST 395* 340*  --   --   ALT 96* 97*  --   --   ALKPHOS 34* 40  --   --   BILITOT 0.5 1.4*  --   --   PROT 4.1* 4.6*  --   --   ALBUMIN 2.4* 2.5* 2.2* 2.2*   No results for input(s): LIPASE, AMYLASE in the last 168 hours. No results for input(s): AMMONIA in the last 168 hours. CBC:  Recent Labs Lab 02/08/16 0250 02/08/16 1110 02/09/16 0618 02/09/16 1705 02/10/16 0309 02/11/16 0605  WBC 8.1 9.3 7.2  --  6.0 6.2  HGB 8.3* 9.3* 8.9* 8.6* 8.1* 10.6*  HCT 25.9* 28.6* 26.8* 26.2* 25.0* 30.4*  MCV 92.2 90.8 90.8  --  91.9 89.9  PLT 119* 112* 100* 95* 90* 100*   Cardiac Enzymes:  Recent Labs Lab 02/09/16 1206  02/10/16 0308 02/10/16 1130 02/10/16 2128 02/11/16 0605 02/11/16 1126  CKTOTAL [id number]*  --   --  15006*  --  8137*  --   TROPONINI 0.36*  < > 0.30* 0.28* 0.23* 0.19* 0.18*  < > = values in this interval not displayed. CBG:  Recent Labs Lab 01/27/2016 1750  GLUCAP 194*    Iron Studies: No results for input(s): IRON,  TIBC, TRANSFERRIN, FERRITIN in the last 72 hours. Studies/Results: Dg Chest Port 1 View  02/11/2016  CLINICAL DATA:  82 year old female with history of pleural effusion and congestive heart failure. EXAM: PORTABLE CHEST 1 VIEW COMPARISON:  Chest x-ray 02/09/2016. FINDINGS: There is a right-sided internal jugular central venous catheter with tip terminating in the mid superior vena cava. Lung volumes are low. There are patchy interstitial and airspace opacities asymmetrically distributed throughout the lungs bilaterally, concerning for multilobar bronchopneumonia. Bibasilar opacities likely reflect subsegmental atelectasis. No cephalization of the pulmonary vasculature. Small bilateral pleural effusions. Mild cardiomegaly. The patient is rotated to the left on today's exam, resulting in distortion of the mediastinal contours and reduced diagnostic sensitivity and specificity for mediastinal pathology. Atherosclerosis in the thoracic aorta. Status post vertebroplasty of a mid thoracic vertebral body. IMPRESSION: 1. Support apparatus, as above. 2. Findings remain concerning for multilobar bronchopneumonia, overall with aeration very similar to the prior study. 3. Low lung volumes with probable  bibasilar subsegmental atelectasis. 4. Mild cardiomegaly. 5. Atherosclerosis. Electronically Signed   By: Trudie Reed M.D.   On: 02/11/2016 10:31   Dg Chest Port 1 View  02/09/2016  CLINICAL DATA:  82 year old female with history of central line placement. EXAM: PORTABLE CHEST 1 VIEW COMPARISON:  Chest x-ray 02/07/2016. FINDINGS: There is a right-sided internal jugular central venous catheter with tip terminating in the proximal superior vena cava. No pneumothorax. Lung volumes are low. Multifocal airspace disease, most evident in the lung bases bilaterally, in addition to the right upper lobe. Probable small bilateral pleural effusions. Cephalization of the pulmonary vasculature. Heart size is mildly enlarged. The  patient is rotated to the left on today's exam, resulting in distortion of the mediastinal contours and reduced diagnostic sensitivity and specificity for mediastinal pathology. Atherosclerosis in the thoracic aorta. Post vertebroplasty changes are noted in a mid thoracic vertebral body. IMPRESSION: 1. Findings are favored to reflect multilobar pneumonia. 2. There may be a component of mild interstitial pulmonary edema given the presence of cardiomegaly and cephalization of the pulmonary vasculature, however, findings are too asymmetric to be explained by edema alone. 3. Small bilateral pleural effusions. 4. Atherosclerosis. Electronically Signed   By: Trudie Reed M.D.   On: 02/09/2016 23:31   Medications: Infusions: . sodium chloride Stopped (02/11/16 0206)  . dextrose 5 % 1,000 mL with sodium acetate 150 mEq infusion 100 mL/hr at 02/11/16 1400  . heparin 1,300 Units/hr (02/11/16 1400)    Scheduled Medications: . amiodarone  400 mg Oral BID  . [START ON 02/12/2016] cyanocobalamin  2,000 mcg Intramuscular Q30 days  . docusate sodium  100 mg Oral Daily  . donepezil  10 mg Oral QHS  . ferrous sulfate  325 mg Oral Q breakfast  . levothyroxine  75 mcg Oral QAC breakfast  . lidocaine  10 mL Intradermal Once  . pantoprazole  40 mg Oral Daily  . sodium chloride  250 mL Intravenous Once    have reviewed scheduled and prn medications.  Physical Exam: General: awake, alert, NAD, pleasant Heart: RRR, no murmurs Lungs: CTAB, on Room air Abdomen: obese, soft, NT/ND, +BS Extremities: RLE with +1 edema to ankle (calf wrapped with drain in place), LLE with +1 edema Neuro: AOx1 (self only), cooperates with exam this am. Dialysis Access:  R IJ    Assessment/ Plan: Valerie Cordova is a 82 y.o. female that was admitted on 4/25 for RLE ischemia and new onset Afib with RVR. PMH dementia. She is s/p R femoral embolectomy, vein patch angioplasty and 4 compartment fasciotomy. Baseline renal function  unknown.   1.Renal- AKI in the setting of hypotension/ rhabdomyolysis/ decreased renal perfusion. Likely has renal artery stenosis, given atrophic right kidney on renal u/s 4/26. UOP 750cc. Daily RFP.  CK 8100.  Cr 6.56. K 3.6. Phos 5.5. FENa 5.66%. U eosinophils pending. SPEP, UPEP pending. Renal artery doppler inconclusive. Wt 211lb from 183lb on admission. NS 128ml/hr 2. Hypertension/volume - hypotensive 70-80s systolic.  3. New onset Afib- NSR now. On Heparin gtt. Management per Cards 4. Anemia - Hgb 10.6 5. Dementia - Aricept per primary 6. Acute combined systolic/diastolic CHF 7. Hypothyroidism  Holding on HD w/ improvement overnight. CK improved. Continue IVF. Watch UOP.   Kathee Delton, MD,MS,  PGY2 02/11/2016 2:31 PM   LOS: 5 days     I have seen and examined this patient and agree with plan as outlined by Dr. Wende Mott. Her UOP has improved after starting IVF's and her CK  levels have also been decreasing.  Will hold off on HD for now and continue to monitor as long as she doesn't develop pulmonary edema.  Pt is a poor longterm HD candidate and does have underlying CKD stage 3 (related to obstructive uropathy and HTN). Valerie Nordmann Xzavien Harada,MD 02/11/2016 2:41 PM

## 2016-02-11 NOTE — Progress Notes (Signed)
[facility]  PCCM to sign off Call if questions  Heber CarolinaBrent Aislin Onofre, MD [facility] Pager: [phone] Cell: [phone] After 3pm or if no response, call [phone]

## 2016-02-12 LAB — RENAL FUNCTION PANEL
ALBUMIN: 2.2 g/dL — AB (ref 3.5–5.0)
ANION GAP: 13 (ref 5–15)
BUN: 37 mg/dL — ABNORMAL HIGH (ref 6–20)
CALCIUM: 7.9 mg/dL — AB (ref 8.9–10.3)
CO2: 33 mmol/L — AB (ref 22–32)
CREATININE: 6.53 mg/dL — AB (ref 0.44–1.00)
Chloride: 90 mmol/L — ABNORMAL LOW (ref 101–111)
GFR calc Af Amer: 6 mL/min — ABNORMAL LOW (ref >60)
GFR, EST NON AFRICAN AMERICAN: 5 mL/min — AB (ref >60)
GLUCOSE: 107 mg/dL — AB (ref 65–99)
Phosphorus: 4.7 mg/dL — ABNORMAL HIGH (ref 2.5–4.6)
Potassium: 3.1 mmol/L — ABNORMAL LOW (ref 3.5–5.1)
SODIUM: 136 mmol/L (ref 135–145)

## 2016-02-12 LAB — CBC
HEMATOCRIT: 29.4 % — AB (ref 36.0–46.0)
HEMOGLOBIN: 10 g/dL — AB (ref 12.0–15.0)
MCH: 30.8 pg (ref 26.0–34.0)
MCHC: 34 g/dL (ref 30.0–36.0)
MCV: 90.5 fL (ref 78.0–100.0)
Platelets: 111 10*3/uL — ABNORMAL LOW (ref 150–400)
RBC: 3.25 MIL/uL — AB (ref 3.87–5.11)
RDW: 15.3 % (ref 11.5–15.5)
WBC: 5.9 10*3/uL (ref 4.0–10.5)

## 2016-02-12 LAB — PROTEIN ELECTROPHORESIS, SERUM
A/G RATIO SPE: 1.1 (ref 0.7–1.7)
ALBUMIN ELP: 2.5 g/dL — AB (ref 2.9–4.4)
Alpha-1-Globulin: 0.4 g/dL (ref 0.0–0.4)
Alpha-2-Globulin: 0.6 g/dL (ref 0.4–1.0)
Beta Globulin: 0.7 g/dL (ref 0.7–1.3)
Gamma Globulin: 0.6 g/dL (ref 0.4–1.8)
Globulin, Total: 2.3 g/dL (ref 2.2–3.9)
TOTAL PROTEIN ELP: 4.8 g/dL — AB (ref 6.0–8.5)

## 2016-02-12 LAB — CULTURE, BLOOD (ROUTINE X 2)
CULTURE: NO GROWTH
Culture: NO GROWTH

## 2016-02-12 LAB — CK: Total CK: 3734 U/L — ABNORMAL HIGH (ref 38–234)

## 2016-02-12 LAB — HEPARIN LEVEL (UNFRACTIONATED): Heparin Unfractionated: 0.3 IU/mL (ref 0.30–0.70)

## 2016-02-12 MED ORDER — WARFARIN SODIUM 2.5 MG PO TABS
2.5000 mg | ORAL_TABLET | Freq: Once | ORAL | Status: AC
Start: 2016-02-12 — End: 2016-02-12
  Administered 2016-02-12: 2.5 mg via ORAL
  Filled 2016-02-12: qty 1

## 2016-02-12 MED ORDER — WARFARIN - PHARMACIST DOSING INPATIENT: Freq: Every day | Status: DC

## 2016-02-12 MED ORDER — FUROSEMIDE 10 MG/ML IJ SOLN
100.0000 mg | Freq: Three times a day (TID) | INTRAVENOUS | Status: DC
  Administered 2016-02-13: 100 mg via INTRAVENOUS
  Filled 2016-02-12 (×4): qty 10

## 2016-02-12 MED ORDER — PATIENT'S GUIDE TO USING COUMADIN BOOK
Freq: Once | Status: AC
  Administered 2016-02-13: 10:00:00
  Filled 2016-02-12: qty 1

## 2016-02-12 MED ORDER — GERHARDT'S BUTT CREAM
TOPICAL_CREAM | CUTANEOUS | Status: DC | PRN
  Filled 2016-02-12: qty 1

## 2016-02-12 MED ORDER — WARFARIN VIDEO: Freq: Once | Status: DC

## 2016-02-12 NOTE — Progress Notes (Signed)
Subjective:    No concerns today, reports improving right leg pain  Objective Vital signs in last 24 hours: Filed Vitals:   02/12/16 0700 02/12/16 0749 02/12/16 0800 02/12/16 0900  BP: 110/85  113/71 123/91  Pulse:   93 69  Temp:  97.3 F (36.3 C)    TempSrc:  Axillary    Resp: Height:      Weight:      SpO2:   98% 97%   Weight change:   Intake/Output Summary (Last 24 hours) at 02/12/16 0945 Last data filed at 02/12/16 0900  Gross per 24 hour  Intake   2740 ml  Output   1125 ml  Net   1615 ml   UOP over last 12 hours- 0.5 ml/kg/hr  Labs: Basic Metabolic Panel:  Recent Labs Lab 02/10/16 0007 02/11/16 0605 02/12/16 0500  NA 135 135 136  K 4.1 3.6 3.1*  CL 98* 94* 90*  CO2 25 29 33*  GLUCOSE 116* 107* 107*  BUN 34* 37* 37*  CREATININE 6.24* 6.56* 6.53*  CALCIUM 7.4* 7.8* 7.9*  PHOS 5.8* 5.5* 4.7*   Liver Function Tests:  Recent Labs Lab 02/07/16 2059 02/09/16 1206 02/10/16 0007 02/11/16 0605 02/12/16 0500  AST 395* 340*  --   --   --   ALT 96* 97*  --   --   --   ALKPHOS 34* 40  --   --   --   BILITOT 0.5 1.4*  --   --   --   PROT 4.1* 4.6*  --   --   --   ALBUMIN 2.4* 2.5* 2.2* 2.2* 2.2*   No results for input(s): LIPASE, AMYLASE in the last 168 hours. No results for input(s): AMMONIA in the last 168 hours. CBC:  Recent Labs Lab 02/08/16 1110 02/09/16 0618  02/10/16 0309 02/11/16 0605 02/12/16 0320  WBC 9.3 7.2  --  6.0 6.2 5.9  HGB 9.3* 8.9*  < > 8.1* 10.6* 10.0*  HCT 28.6* 26.8*  < > 25.0* 30.4* 29.4*  MCV 90.8 90.8  --  91.9 89.9 90.5  PLT 112* 100*  < > 90* 100* 111*  < > = values in this interval not displayed. Cardiac Enzymes:  Recent Labs Lab 02/09/16 1206  02/10/16 0308 02/10/16 1130 02/10/16 2128 02/11/16 0605 02/11/16 1126 02/12/16 0320  CKTOTAL 16109*  --   --  15006*  --  8137*  --  3734*  TROPONINI 0.36*  < > 0.30* 0.28* 0.23* 0.19* 0.18*  --   < > = values in this interval not displayed. CBG:  Recent  Labs Lab 01/29/2016 1750  GLUCAP 194*    Iron Studies: No results for input(s): IRON, TIBC, TRANSFERRIN, FERRITIN in the last 72 hours. Studies/Results: Dg Chest Port 1 View  02/11/2016  CLINICAL DATA:  82 year old female with history of pleural effusion and congestive heart failure. EXAM: PORTABLE CHEST 1 VIEW COMPARISON:  Chest x-ray 02/09/2016. FINDINGS: There is a right-sided internal jugular central venous catheter with tip terminating in the mid superior vena cava. Lung volumes are low. There are patchy interstitial and airspace opacities asymmetrically distributed throughout the lungs bilaterally, concerning for multilobar bronchopneumonia. Bibasilar opacities likely reflect subsegmental atelectasis. No cephalization of the pulmonary vasculature. Small bilateral pleural effusions. Mild cardiomegaly. The patient is rotated to the left on today's exam, resulting in distortion of the mediastinal contours and reduced diagnostic sensitivity and specificity for mediastinal pathology. Atherosclerosis in the thoracic aorta. Status  post vertebroplasty of a mid thoracic vertebral body. IMPRESSION: 1. Support apparatus, as above. 2. Findings remain concerning for multilobar bronchopneumonia, overall with aeration very similar to the prior study. 3. Low lung volumes with probable bibasilar subsegmental atelectasis. 4. Mild cardiomegaly. 5. Atherosclerosis. Electronically Signed   By: Trudie Reedaniel  Entrikin M.D.   On: 02/11/2016 10:31   Medications: Infusions: . sodium chloride Stopped (02/11/16 0206)  . dextrose 5 % 1,000 mL with sodium acetate 150 mEq infusion 100 mL/hr at 02/12/16 0700  . heparin 1,500 Units/hr (02/12/16 0700)    Scheduled Medications: . amiodarone  400 mg Oral BID  . cyanocobalamin  2,000 mcg Intramuscular Q30 days  . docusate sodium  100 mg Oral Daily  . donepezil  10 mg Oral QHS  . ferrous sulfate  325 mg Oral Q breakfast  . levothyroxine  75 mcg Oral QAC breakfast  . lidocaine  10  mL Intradermal Once  . pantoprazole  40 mg Oral Daily  . sodium chloride  250 mL Intravenous Once    have reviewed scheduled and prn medications.  Physical Exam: General: awake, alert, NAD, pleasant Heart: RRR, no murmurs Lungs: CTAB, on Room air Abdomen: obese, soft, NT/ND, +BS Extremities: RLE with +1 edema to ankle (calf wrapped with drain in place), LLE with +1 edema Neuro: AOx1 (self only), cooperative Dialysis Access:  R IJ    Assessment/ Plan: Valerie Cordova is a 82 y.o. Female admitted on 4/25 for RLE ischemia in setting of  new onset Afib with RVR. PMH sig for dementia. She is s/p R femoral embolectomy, vein patch angioplasty and 4 compartment fasciotomy. Baseline renal function unknown.   1.Renal- AKI in the setting of hypotension/ rhabdomyolysis/ decreased renal perfusion. Likely has renal artery stenosis, given atrophic right kidney on renal u/s 4/26. UOP 835 cc over last 24. Daily RFP.  CK 3734.  Cr 6.53. K 3.1. Phos 4.7  FENa 5.66%. U eosinophils pending. SPEP, UPEP pending. Renal artery doppler inconclusive. Wt 218 lb from 183lb on admission. Will d/c NS. Discussed with pharmacy reducing volume of heparin carrier fluid but currenlty at 16 cc./hr and unable to concentrate furthhr 2. Hypertension/volume - hypotension resolved  3. New onset Afib- NSR now. On Heparin gtt. Management per Cards 4. Anemia - Hgb 10.0 5. Dementia - Aricept per primary 6. Acute combined systolic/diastolic CHF 7. Hypothyroidism  Improving UOP, Cr, CK. Continue to hold HD for now. Continue to monitor closely for fluid overload espcially as weight is increasing. Maintain HD cath for now, d/c IVF, watch UOP.     Alyssa A. Kennon RoundsHaney MD, MS Family Medicine Resident PGY-2 Pager [phone]  Renal Attending: Agree with above note. Pt examine, hx reviewed. Low UOP of concern. Pos fluid balance. Will try to diurese. Debra Calabretta C

## 2016-02-12 NOTE — Progress Notes (Addendum)
ANTICOAGULATION CONSULT NOTE   Pharmacy Consult for heparin>>warfarin Indication: new onset atrial fibrillation with acute embolus to right lower extremity  Allergies  Allergen Reactions  . Codeine Itching and Nausea And Vomiting  . Vicodin [Hydrocodone-Acetaminophen] Itching and Nausea And Vomiting    Patient Measurements: Height: 5\' 2"  (157.5 cm) Weight: 218 lb 4.1 oz (99 kg) IBW/kg (Calculated) : 50.1 Heparin Dosing Weight: ~69 kg  Vital Signs: Temp: 97.3 F (36.3 C) (05/01 0749) Temp Source: Axillary (05/01 0749) BP: 123/91 mmHg (05/01 0900) Pulse Rate: 69 (05/01 0900)  Labs:  Recent Labs  02/09/16 1705 02/10/16 0007  02/10/16 0309  02/10/16 1130  02/10/16 2128 02/11/16 0605 02/11/16 0946 02/11/16 1126 02/11/16 1830 02/12/16 0320 02/12/16 0500  HGB 8.6*  --   --  8.1*  --   --   --   --  10.6*  --   --   --  10.0*  --   HCT 26.2*  --   --  25.0*  --   --   --   --  30.4*  --   --   --  29.4*  --   PLT 95*  --   --  90*  --   --   --   --  100*  --   --   --  111*  --   APTT 58*  --   --   --   --   --   --   --   --   --   --   --   --   --   LABPROT 16.0*  --   --   --   --   --   --   --   --   --   --   --   --   --   INR 1.27  --   --   --   --   --   --   --   --   --   --   --   --   --   HEPARINUNFRC <0.10*  --   --   --   < >  --   < >  --   --  0.37  --  0.19* 0.30  --   CREATININE  --  6.24*  --   --   --   --   --   --  6.56*  --   --   --   --  6.53*  CKTOTAL  --   --   --   --   --  15006*  --   --  8137*  --   --   --  3734*  --   TROPONINI 0.42*  --   < >  --   --  0.28*  --  0.23* 0.19*  --  0.18*  --   --   --   < > = values in this interval not displayed.  Estimated Creatinine Clearance: 7.4 mL/min (by C-G formula based on Cr of 6.53).   Assessment: 89 yof with new onset atrial fibrillation with acute embolus to right lower extremity. Initially started on heparin on 4/25 that was stopped due R femoral embolectomy and 4 compartment  fasciotomy.  Pharmacy has been managing heparin for afib. Orders to start warfarin tonight now that pltc improved. With age and concomitant amiodarone will start with low dose tonight of 2.5mg .   -heparin level at goal (  HL= 0.30)  -hg= 10 (s/p PRBC), plt= 111 (up from 90 on 4/29)    Goal of Therapy:  INR goal 2-3 Heparin level 0.3-0.7 units/ml Monitor platelets by anticoagulation protocol: Yes   Plan:  -Increase heparin to 1600 units/hr -Will shoot towards low end of goal with platelet trend -Heparin level daily wth CBC daily -Warfarin to start at low dose 2.5mg  tonight  Sheppard Coil PharmD., BCPS Clinical Pharmacist Pager [phone] 02/12/2016 10:08 AM

## 2016-02-12 NOTE — Progress Notes (Addendum)
   VASCULAR SURGERY ASSESSMENT & PLAN:   6 Days Post-Op s/p: Right femoral embolectomy, vein patch angioplasty of the femoral artery, and 4 compartment fasciotomy.    The right foot is warm and well-perfused. The fasciotomy site looks good with well-perfused muscle. However, there is significant swelling and the wound is not ready for closure. Will continue VAC for now.   CARDIAC: Patient with paroxysmal atrial fibrillation and combined systolic and diastolic heart failure. Cardiology is following.  She will likely need long-term anticoagulation. Will start this now. The patient is currently on IV heparin.   RENAL:  crt = 6.53. Adequate urine output. Renal is following. No plans for HD currently.   CCM has signed off. Family medicine is assisting with management of multiple medical issues and I appreciate their help.  SUBJECTIVE: No complaints.  PHYSICAL EXAM: Filed Vitals:   02/12/16 0700 02/12/16 0749 02/12/16 0800 02/12/16 0900  BP: 110/85  113/71 123/91  Pulse:   93 69  Temp:  97.3 F (36.3 C)    TempSrc:  Axillary    Resp: 11  13 14   Height:      Weight:      SpO2:   98% 97%   Right foot is warm and well perfused. I  [facility]. The muscle looks well perfused. There is still significant swelling and the wound cannot be closed currently. The right groin incision looks fine.  LABS: Lab Results  Component Value Date   WBC 5.9 02/12/2016   HGB 10.0* 02/12/2016   HCT 29.4* 02/12/2016   MCV 90.5 02/12/2016   PLT 111* 02/12/2016   Lab Results  Component Value Date   CREATININE 6.53* 02/12/2016   Lab Results  Component Value Date   INR 1.27 02/09/2016    Principal Problem:   Critical lower limb ischemia Active Problems:   Atrial fibrillation with RVR (HCC)   Ischemia of lower extremity   Acute systolic CHF (congestive heart failure) (HCC)   Elevated troponin   Hypotension   Acute post-hemorrhagic anemia   Anuria   AKI  (acute kidney injury) (HCC)    Cari Carawayhris Carle Dargan Beeper: [phone]: [phone] 02/12/2016

## 2016-02-12 NOTE — Progress Notes (Addendum)
       Patient Name: Valerie NettlesBetty Cordova Date of Encounter: 02/12/2016    SUBJECTIVE:no cardiac complaints  TELEMETRY:  Atrial fibrillation with controlled rate: Filed Vitals:   02/12/16 0900 02/12/16 1000 02/12/16 1100 02/12/16 1200  BP: 123/91 112/63 119/75 109/63  Pulse: 69 76 81 105  Temp:      TempSrc:      Resp: 14 13 18 15   Height:      Weight:      SpO2: 97% 96% 96%     Intake/Output Summary (Last 24 hours) at 02/12/16 1231 Last data filed at 02/12/16 1200  Gross per 24 hour  Intake 2747.87 ml  Output   1025 ml  Net 1722.87 ml   LABS: Basic Metabolic Panel:  Recent Labs  16/07/9603/29/17 0529 02/11/16 0605 02/12/16 0500  NA  --  135 136  K  --  3.6 3.1*  CL  --  94* 90*  CO2  --  29 33*  GLUCOSE  --  107* 107*  BUN  --  37* 37*  CREATININE  --  6.56* 6.53*  CALCIUM  --  7.8* 7.9*  MG 2.3  --   --   PHOS  --  5.5* 4.7*   CBC:  Recent Labs  02/11/16 0605 02/12/16 0320  WBC 6.2 5.9  HGB 10.6* 10.0*  HCT 30.4* 29.4*  MCV 89.9 90.5  PLT 100* 111*   Cardiac Enzymes:  Recent Labs  02/10/16 1130 02/10/16 2128 02/11/16 0605 02/11/16 1126 02/12/16 0320  CKTOTAL [id number]*  --  8137*  --  3734*  TROPONINI 0.28* 0.23* 0.19* 0.18*  --    BNP: Invalid input(s): POCBNP Hemoglobin A1C: No results for input(s): HGBA1C in the last 72 hours. Fasting Lipid Panel: No results for input(s): CHOL, HDL, LDLCALC, TRIG, CHOLHDL, LDLDIRECT in the last 72 hours.  Radiology/Studies:  No new data  Physical Exam: Blood pressure 109/63, pulse 105, temperature 97.3 F (36.3 C), temperature source Axillary, resp. rate 15, height 5\' 2"  (1.575 m), weight 218 lb 4.1 oz (99 kg), SpO2 96 %. Weight change:   Wt Readings from Last 3 Encounters:  02/12/16 218 lb 4.1 oz (99 kg)    Lying in recliner in no distress Chest is clear anteriorly Cardiac exam reveals no rub or gallop.  ASSESSMENT:  1. Paroxysmal atrial fibrillation 2. Systemic embolization secondary to A.  fib  Plan: 1. Continue amiodarone with the expectation that she will develop sinus rhythm and be maintained as amiodarone loads 2. Watch for bradycardia 3. Agree with long-term anticoagulation.  Selinda EonSigned, Gursimran Litaker III,Jonquil Stubbe W 02/12/2016, 12:31 PM

## 2016-02-12 NOTE — Progress Notes (Signed)
Occupational Therapy Treatment Patient Details Name: Valerie NettlesBetty Turvey MRN: [id number] DOB: 09/28/1932 Today's Date: 02/12/2016    History of present illness 82 y.o. female with Alzheimers admitted for acute critical limb ischemia secondary to acute right LE arterial occlusion. Found to be in new onset atrial fibrillation w/ RVR. Pt now s/p right femoral embolectomy, vein patch angioplasty femoral artery, and 4 compartment fasciotomy. PMH includes UTI, gout, CHF, dysphagia, Alzheimers, depression, hypothyroid disease, GERD, gout.   OT comments  Pt continues to require +2 min to mod assist depending on her effort. Needs increased encouragement and multimodal cues to prevent her from sitting abruptly. Pt stating she could not walk, but then demonstrated ability to ambulate to the door with RW and chair very close. Pt able to self feed with assist to initiate and verbal cues to remain on task. SNF continues to be the optimal discharge environment for this pt.  Follow Up Recommendations  SNF;Supervision/Assistance - 24 hour    Equipment Recommendations       Recommendations for Other Services      Precautions / Restrictions Precautions Precautions: Fall Precaution Comments: sits or lays down abruptly, wound vac on R LE Restrictions Weight Bearing Restrictions: No       Mobility Bed Mobility Overal bed mobility: Needs Assistance;+2 for physical assistance Bed Mobility: Supine to Sit     Supine to sit: +2 for physical assistance;Mod assist     General bed mobility comments: assist to initiate, for LEs off EOB and to elevate trunk, min guard assist to prevent pt from laying back down  Transfers Overall transfer level: Needs assistance Equipment used: Rolling walker (2 wheeled) Transfers: Sit to/from [employer] CorporationStand;Stand Pivot Transfers Sit to Stand: +2 physical assistance;Min assist;Mod assist Stand pivot transfers: +2 physical assistance;Mod assist       General transfer comment: assist to  rise and steady, level of assist depends on her effort, needs encouragement to avoid sitting back down, kept chair very close throughout    Balance Overall balance assessment: Needs assistance Sitting-balance support: Feet supported Sitting balance-Leahy Scale: Fair Sitting balance - Comments: min guard for safety     Standing balance-Leahy Scale: Poor Standing balance comment: tactile cues to stand tall, sits without notice                   ADL Overall ADL's : Needs assistance/impaired Eating/Feeding: Minimal assistance;Sitting Eating/Feeding Details (indicate cue type and reason): assist to initiate spoon feeding and maintain attention to task, drinks with a straw with assist to return cup to table                         Toileting- Clothing Manipulation and Hygiene: Total assistance;Sit to/from stand       Functional mobility during ADLs: +2 for physical assistance;Minimal assistance;Moderate assistance;Rolling walker;Cueing for safety        Vision                     Perception     Praxis      Cognition   Behavior During Therapy: WFL for tasks assessed/performed Overall Cognitive Status: No family/caregiver present to determine baseline cognitive functioning Area of Impairment: Memory;Following commands;Safety/judgement;Attention;Orientation Orientation Level: Disoriented to;Place;Time Current Attention Level: Sustained Memory: Decreased short-term memory  Following Commands: Follows one step commands with increased time (and multimodal cues) Safety/Judgement: Decreased awareness of safety;Decreased awareness of deficits     General Comments: Pt repeating, "I can't walk.' Needs encouragement  to do what she is able.     Extremity/Trunk Assessment               Exercises     Shoulder Instructions       General Comments      Pertinent Vitals/ Pain       Pain Assessment: Faces Faces Pain Scale: No hurt  Home Living                                           Prior Functioning/Environment              Frequency Min 2X/week     Progress Toward Goals  OT Goals(current goals can now be found in the care plan section)  Progress towards OT goals: Progressing toward goals  Acute Rehab OT Goals Patient Stated Goal: did not state Time For Goal Achievement: 02/21/16 Potential to Achieve Goals: Fair  Plan Discharge plan remains appropriate    Co-evaluation    PT/OT/SLP Co-Evaluation/Treatment: Yes Reason for Co-Treatment: Necessary to address cognition/behavior during functional activity;For patient/therapist safety   OT goals addressed during session: ADL's and self-care      End of Session Equipment Utilized During Treatment: Gait belt;Rolling walker   Activity Tolerance Patient tolerated treatment well   Patient Left in chair;with call bell/phone within reach;with chair alarm set;with nursing/sitter in room   Nurse Communication Mobility status (changed pulse ox probe, pt with variable 02 readings)        Time: 4540-9811 OT Time Calculation (min): 32 min  Charges: OT General Charges $OT Visit: 1 Procedure OT Treatments $Self Care/Home Management : 8-22 mins  Evern Bio 02/12/2016, 10:56 AM  [phone]

## 2016-02-12 NOTE — Progress Notes (Signed)
Physical Therapy Treatment Patient Details Name: Valerie NettlesBetty Vancleve MRN: [id number] DOB: 05-03-1932 Today's Date: 02/12/2016    History of Present Illness 82 y.o. female with Alzheimers admitted for acute critical limb ischemia secondary to acute right LE arterial occlusion. Found to be in new onset atrial fibrillation w/ RVR. Pt now s/p right femoral embolectomy, vein patch angioplasty femoral artery, and 4 compartment fasciotomy. PMH includes UTI, gout, CHF, dysphagia, Alzheimers, depression, hypothyroid disease, GERD, gout.    PT Comments    Pt admitted with above diagnosis. Pt currently with functional limitations due to balance and endurance deficits. Pt was able to ambulate with +2 person assist needing mod assist of 2 and max cues as pt sits without notice.  Pt need SNF for therapy.   Pt will benefit from skilled PT to increase their independence and safety with mobility to allow discharge to the venue listed below.    Follow Up Recommendations  SNF     Equipment Recommendations  None recommended by PT    Recommendations for Other Services       Precautions / Restrictions Precautions Precautions: Fall Precaution Comments: sits or lays down abruptly, wound vac on R LE Restrictions Weight Bearing Restrictions: No    Mobility  Bed Mobility Overal bed mobility: Needs Assistance;+2 for physical assistance Bed Mobility: Supine to Sit     Supine to sit: +2 for physical assistance;Mod assist     General bed mobility comments: assist to initiate, for LEs off EOB and to elevate trunk, min guard assist to prevent pt from laying back down  Transfers Overall transfer level: Needs assistance Equipment used: Rolling walker (2 wheeled) Transfers: Sit to/from [employer]Stand;Stand Pivot Transfers Sit to Stand: +2 physical assistance;Min assist;Mod assist Stand pivot transfers: +2 physical assistance;Mod assist       General transfer comment: assist to rise and steady, level of assist depends on  her effort, needs encouragement to avoid sitting back down, kept chair very close throughout  Ambulation/Gait Ambulation/Gait assistance: Mod assist;+2 physical assistance Ambulation Distance (Feet): 15 Feet Assistive device: Rolling walker (2 wheeled) Gait Pattern/deviations: Step-to pattern;Decreased stride length;Decreased step length - right;Decreased step length - left;Antalgic;Leaning posteriorly;Trunk flexed   Gait velocity interpretation: Below normal speed for age/gender General Gait Details: Pt needed max encouragement to maintain standing for ambulation.  Pt would begin walking and then lean posteriorly like she was going to sit and did this several times needing constant tactile and verbal cues to stand upright.  Had to steer RW and cue pt for each step as well.  Maintained pts upright posture with tactile assist and verbal cues.  Able to walk to door with this max encouragement and mod assist.  Close chair follow as pt will sit when she feels she wants to.     Stairs            Wheelchair Mobility    Modified Rankin (Stroke Patients Only)       Balance Overall balance assessment: Needs assistance;History of Falls Sitting-balance support: Bilateral upper extremity supported;Feet supported Sitting balance-Leahy Scale: Fair Sitting balance - Comments: min guard for safety Postural control: Posterior lean Standing balance support: Bilateral upper extremity supported;During functional activity Standing balance-Leahy Scale: Poor Standing balance comment: tactile and verbal cues for standing with RW, sits without notice.                    Cognition Arousal/Alertness: Awake/alert Behavior During Therapy: WFL for tasks assessed/performed Overall Cognitive Status: No family/caregiver present to  determine baseline cognitive functioning Area of Impairment: Memory;Following commands;Safety/judgement;Attention;Orientation Orientation Level: Disoriented  to;Place;Time Current Attention Level: Sustained Memory: Decreased short-term memory Following Commands: Follows one step commands with increased time (and multimodal cues) Safety/Judgement: Decreased awareness of safety;Decreased awareness of deficits     General Comments: Pt repeating, "I can't walk.' Needs encouragement to do what she is able.     Exercises General Exercises - Lower Extremity Long Arc Quad: AROM;Both;5 reps;Seated    General Comments General comments (skin integrity, edema, etc.): VAC in place right LE.      Pertinent Vitals/Pain Pain Assessment: Faces Faces Pain Scale: Hurts little more Pain Location: right LE Pain Descriptors / Indicators: Aching;Grimacing;Guarding Pain Intervention(s): Limited activity within patient's tolerance;Monitored during session;Repositioned  VSS with sats 87-89% on RA with activity. Replaced on 2LO2 with sats >90%.  Other VSS.      Home Living                      Prior Function            PT Goals (current goals can now be found in the care plan section) Acute Rehab PT Goals Patient Stated Goal: did not state Progress towards PT goals: Progressing toward goals    Frequency  Min 2X/week    PT Plan Current plan remains appropriate    Co-evaluation PT/OT/SLP Co-Evaluation/Treatment: Yes Reason for Co-Treatment: Complexity of the patient's impairments (multi-system involvement);For patient/therapist safety PT goals addressed during session: Mobility/safety with mobility OT goals addressed during session: ADL's and self-care     End of Session Equipment Utilized During Treatment: Gait belt;Oxygen Activity Tolerance: Patient limited by fatigue Patient left: in chair;with call bell/phone within reach;with chair alarm set;with nursing/sitter in room     Time: 1010-1038 PT Time Calculation (min) (ACUTE ONLY): 28 min  Charges:  $Gait Training: 8-22 mins                    G CodesBerline Lopes 17-Feb-2016, 11:36 AM Eber Jones Acute Rehabilitation [phone] (pager)

## 2016-02-12 DEATH — deceased

## 2016-02-13 LAB — CBC
HEMATOCRIT: 31 % — AB (ref 36.0–46.0)
HEMOGLOBIN: 10.3 g/dL — AB (ref 12.0–15.0)
MCH: 30.8 pg (ref 26.0–34.0)
MCHC: 33.2 g/dL (ref 30.0–36.0)
MCV: 92.8 fL (ref 78.0–100.0)
Platelets: 132 10*3/uL — ABNORMAL LOW (ref 150–400)
RBC: 3.34 MIL/uL — AB (ref 3.87–5.11)
RDW: 15.2 % (ref 11.5–15.5)
WBC: 6.4 10*3/uL (ref 4.0–10.5)

## 2016-02-13 LAB — RENAL FUNCTION PANEL
ALBUMIN: 2.2 g/dL — AB (ref 3.5–5.0)
ANION GAP: 12 (ref 5–15)
BUN: 36 mg/dL — ABNORMAL HIGH (ref 6–20)
CALCIUM: 7.8 mg/dL — AB (ref 8.9–10.3)
CO2: 40 mmol/L — ABNORMAL HIGH (ref 22–32)
Chloride: 83 mmol/L — ABNORMAL LOW (ref 101–111)
Creatinine, Ser: 6.51 mg/dL — ABNORMAL HIGH (ref 0.44–1.00)
GFR, EST AFRICAN AMERICAN: 6 mL/min — AB (ref >60)
GFR, EST NON AFRICAN AMERICAN: 5 mL/min — AB (ref >60)
GLUCOSE: 102 mg/dL — AB (ref 65–99)
PHOSPHORUS: 4.2 mg/dL (ref 2.5–4.6)
POTASSIUM: 2.7 mmol/L — AB (ref 3.5–5.1)
SODIUM: 135 mmol/L (ref 135–145)

## 2016-02-13 LAB — HEPARIN LEVEL (UNFRACTIONATED): HEPARIN UNFRACTIONATED: 0.57 [IU]/mL (ref 0.30–0.70)

## 2016-02-13 LAB — PROTIME-INR
INR: 1.26 (ref 0.00–1.49)
PROTHROMBIN TIME: 15.9 s — AB (ref 11.6–15.2)

## 2016-02-13 LAB — CK: Total CK: 1833 U/L — ABNORMAL HIGH (ref 38–234)

## 2016-02-13 LAB — MAGNESIUM: Magnesium: 1.6 mg/dL — ABNORMAL LOW (ref 1.7–2.4)

## 2016-02-13 MED ORDER — POTASSIUM CHLORIDE 10 MEQ/50ML IV SOLN
10.0000 meq | INTRAVENOUS | Status: AC
  Administered 2016-02-13 (×6): 10 meq via INTRAVENOUS
  Filled 2016-02-13 (×2): qty 50

## 2016-02-13 MED ORDER — ENSURE ENLIVE PO LIQD
237.0000 mL | Freq: Three times a day (TID) | ORAL | Status: DC
  Administered 2016-02-14 – 2016-02-15 (×3): 237 mL via ORAL

## 2016-02-13 MED ORDER — WARFARIN SODIUM 2.5 MG PO TABS
2.5000 mg | ORAL_TABLET | Freq: Once | ORAL | Status: AC
Start: 2016-02-13 — End: 2016-02-13
  Administered 2016-02-13: 2.5 mg via ORAL
  Filled 2016-02-13: qty 1

## 2016-02-13 MED ORDER — POTASSIUM CHLORIDE 10 MEQ/50ML IV SOLN
10.0000 meq | INTRAVENOUS | Status: AC
  Administered 2016-02-13 (×2): 10 meq via INTRAVENOUS
  Filled 2016-02-13: qty 50

## 2016-02-13 MED ORDER — POTASSIUM CHLORIDE CRYS ER 20 MEQ PO TBCR
20.0000 meq | EXTENDED_RELEASE_TABLET | Freq: Once | ORAL | Status: DC
  Filled 2016-02-13: qty 1

## 2016-02-13 NOTE — Progress Notes (Signed)
Initial Nutrition Assessment  DOCUMENTATION CODES:   Obesity unspecified  INTERVENTION:   -Ensure Enlive TID. Each supplement provides 350 kcals and 20 grams of protein. -Continue to monitor nutritional needs.     NUTRITION DIAGNOSIS:   Inadequate oral intake related to poor appetite as evidenced by per patient/family report, meal completion < 25%.  GOAL:   Patient will meet greater than or equal to 90% of their needs  MONITOR:   PO intake, Supplement acceptance, Labs, Weight trends, Skin, I & O's, Diet advancement  REASON FOR ASSESSMENT:   Consult Poor PO  ASSESSMENT:   82 y/o female with dementia but no prior cardiac history admitted for acute critical limb ischemia secondary to acute right LE arterial occlusion. Found to be in new onset atrial fibrillation. She was taken to OR emergently by vascular surgery for right femoral embolectomy per Dr. Edilia Boickson On 4/25. Cardiology consulted for new onset atrial fibrillation. Hospitalist consulted for medical management. Patient found to have systolic blood pressures in the 80s over the last 24 hours with oliguric renal failure. Daughter in the room states that the patient has a history of multiple kidney stones. All her hospitalizations have been in [place].Baseline creatinine unknown.  Pt seen for consult for poor PO's.  4/28 pt extubated.  Diet advanced to regular on 4/30.  PO's 0-10% since 4/29.  Pt kept falling asleep during conversation. Pt did not comprehend questions.  Will order Ensure Enlive TID to supplement diet. Pt was amenable to this. Intern encouraged pt to increase PO intake and drink supplements if able.   NFPE: no fat depletion, no muscle depletion, unable to assess edema.   Meds reviewed; B12 2000 mcg, Colace, Ferrous sulfate 325 mg, coumadin.  Labs reviewed; K 2.7, Cl 83, BUN 36, creatinine 6.51, Ca 7.8, GFR 5, glucose 102.    Diet Order:  Diet regular Room service appropriate?: Yes; Fluid consistency::  Thin  Skin:  Wound (see comment) (Incision in groin and R leg)  Last BM:  5/1  Height:   Ht Readings from Last 1 Encounters:  02-04-16 5\' 2"  (1.575 m)    Weight:   Wt Readings from Last 1 Encounters:  02/13/16 222 lb 0.1 oz (100.7 kg)    Ideal Body Weight:  50 kg  BMI:  Body mass index is 40.59 kg/(m^2).  Estimated Nutritional Needs:   Kcal:  1500-1700 (15-17 kcals/kg)  Protein:  60-70 g (1.2 g/kg IBW)  Fluid:  1.5-1.7 L  EDUCATION NEEDS:   No education needs identified at this time  Beryle QuantMeredith Jarion Hawthorne, MS [facility] Dietetic Intern Pager [phone]

## 2016-02-13 NOTE — Clinical Social Work Note (Signed)
Clinical Social Work Assessment  Patient Details  Name: Valerie Cordova MRN: [id number] Date of Birth: 1931/10/21  Date of referral:  02/13/16               Reason for consult:  Discharge Planning                Permission sought to share information with:  Chartered certified accountant granted to share information::  Yes, Verbal Permission Granted  Name::     Luck,Tonya  Agency::  SNF  Relationship::  Patient's daughter  Contact Information:     Housing/Transportation Living arrangements for the past 2 months:  [place] of Information:  Adult Children Patient Interpreter Needed:  None Criminal Activity/Legal Involvement Pertinent to Current Situation/Hospitalization:  No - Comment as needed Significant Relationships:  Adult Children Lives with:  Adult Children Do you feel safe going back to the place where you live?  Yes Need for family participation in patient care:  Yes (Comment)  Care giving concerns:  The patient's daughter is aggregable for short term rehab at discharge.    Social Worker assessment / plan: CSW met with patient at beside to complete assessment. Patient was resting comfortably in bed. Patient was orientated to self only. CSW called patient's daughter. CSW explained PT recommendation for SNF placement. CSW explained SNF search and placement process to the patient's daughter and answered her questions. Patient's daughter reported patient lived with her before her hospital [place]. CSW will follow-up with bed offers.   Employment status:  Retired Forensic scientist:  [employer] PT Recommendations:  [place] / Referral to community resources:  [place]  Patient/Family's Response to care:  The patient's daughter is happy with the care the patient has received.   Patient/Family's Understanding of and Emotional Response to Diagnosis, Current Treatment, and Prognosis:  The patient's  daughter has a good understanding of why the patient was admitted. She understands the care plan and what the patient will need post discharge.  Emotional Assessment Appearance:  Other (Comment Required Attitude/Demeanor/Rapport:  Unable to Assess Affect (typically observed):  Unable to Assess Orientation:  Oriented to Self Alcohol / Substance use:  Not Applicable Psych involvement (Current and /or in the community):  No (Comment)  Discharge Needs  Concerns to be addressed:  Discharge Planning Concerns Readmission within the last 30 days:  No Current discharge risk:  Physical Impairment Barriers to Discharge:  Continued Medical Work up   [employer], LCSW [phone]  02/13/2016, 4:04 PM

## 2016-02-13 NOTE — Progress Notes (Signed)
CRITICAL VALUE ALERT  Critical value received:  Potassium level 2.7  Date of notification:  02/13/2016  Time of notification:  0722  Critical value read back:Yes.    Nurse who received alert:  Lamar Sprinklesechen Avaleen Brownley  MD notified (1st page):  Dr. Edilia Boickson is at the bedside making rounds.   Time of first page:  0725  MD notified (2nd page):  Time of second page:  Responding MD:  Dr. Edilia Boickson  Time MD responded:  Dr. Edilia Boickson is at the bedside and was notified.

## 2016-02-13 NOTE — Progress Notes (Addendum)
  Progress Note  VASCULAR SURGERY ASSESSMENT & PLAN:    Afib: on IV heparin and Coumadin per pharmacy. Cardiology following   AKI: Nephrology following.    Hypokalemia: supplemented.    Transfer to 2W telemetry if ok with Cardiolgy   VAC change MWF. Leg still swollen. No plans to close in OR anytime soon. Therefore, if plan is  for SNF, [facility] I can follow this as an out-pt. Ok to fully coumadinize.   I have interviewed the patient and examined the patient. I agree with the findings by the PA.  Cari Carawayhris Dickson, MD [phone]   02/13/2016 8:08 AM 7 Days Post-Op  Subjective:  Sleepy-awakes to voice and answers questions.   C/o right leg pain  Afebrile HR  70's-90's Afib 100's-120's systolic 95% RA  Gtts: Heparin   Filed Vitals:   02/13/16 0700 02/13/16 0732  BP: 107/71   Pulse: 77   Temp:  97.8 F (36.6 C)  Resp: 13     Physical Exam: Cardiac:  irregular Lungs:  Non labored Incisions:  Fasciotomy site laterally with wound vac in place; medial with dressing in place.  Right groin with clean bandage in place. Extremities:  +doppler signal right DP; some swelling in the right foot.   CBC    Component Value Date/Time   WBC 6.4 02/13/2016 0558   RBC 3.34* 02/13/2016 0558   HGB 10.3* 02/13/2016 0558   HCT 31.0* 02/13/2016 0558   PLT 132* 02/13/2016 0558   MCV 92.8 02/13/2016 0558   MCH 30.8 02/13/2016 0558   MCHC 33.2 02/13/2016 0558   RDW 15.2 02/13/2016 0558    BMET    Component Value Date/Time   NA 135 02/13/2016 0558   K 2.7* 02/13/2016 0558   CL 83* 02/13/2016 0558   CO2 40* 02/13/2016 0558   GLUCOSE 102* 02/13/2016 0558   BUN 36* 02/13/2016 0558   CREATININE 6.51* 02/13/2016 0558   CALCIUM 7.8* 02/13/2016 0558   GFRNONAA 5* 02/13/2016 0558   GFRAA 6* 02/13/2016 0558    INR    Component Value Date/Time   INR 1.26 02/13/2016 0558     Intake/Output Summary (Last 24 hours) at 02/13/16 0808 Last data filed at  02/13/16 0700  Gross per 24 hour  Intake 2785.87 ml  Output   2225 ml  Net 560.87 ml     Assessment:  82 y.o. female is s/p:  Right femoral embolectomy, vein patch angioplasty of the femoral artery, and 4 compartment fasciotomy.  7 Days Post-Op  Plan: -pt with +doppler signal right DP  -DVT prophylaxis:  Heparin gtt for PAF -amiodarone dosing per cardiology -creatinine stable at 6.51 & still making decent urine -wound vac is with good seal.  For change tomorrow -hypokalemia-Dr. Edilia Boickson ordered K-Dur 20mEq x 1 dose for this morning. -recheck labs in am. -appreciate cardiology and family medicine assistance with this pt.   [name], PA-C [facility] [phone] 02/13/2016 8:08 AM

## 2016-02-13 NOTE — Progress Notes (Signed)
ANTICOAGULATION CONSULT NOTE   Pharmacy Consult for heparin>>warfarin Indication: new onset atrial fibrillation with acute embolus to right lower extremity  Allergies  Allergen Reactions  . Codeine Itching and Nausea And Vomiting  . Vicodin [Hydrocodone-Acetaminophen] Itching and Nausea And Vomiting    Patient Measurements: Height: 5\' 2"  (157.5 cm) Weight: 222 lb 0.1 oz (100.7 kg) IBW/kg (Calculated) : 50.1 Heparin Dosing Weight: ~69 kg  Vital Signs: Temp: 97.8 F (36.6 C) (05/02 0732) Temp Source: Oral (05/02 0732) BP: 93/63 mmHg (05/02 1000) Pulse Rate: 91 (05/02 1000)  Labs:  Recent Labs  02/10/16 2128  02/11/16 0605  02/11/16 1126 02/11/16 1830 02/12/16 0320 02/12/16 0500 02/13/16 0558  HGB  --   < > 10.6*  --   --   --  10.0*  --  10.3*  HCT  --   --  30.4*  --   --   --  29.4*  --  31.0*  PLT  --   --  100*  --   --   --  111*  --  132*  LABPROT  --   --   --   --   --   --   --   --  15.9*  INR  --   --   --   --   --   --   --   --  1.26  HEPARINUNFRC  --   --   --   < >  --  0.19* 0.30  --  0.57  CREATININE  --   --  6.56*  --   --   --   --  6.53* 6.51*  CKTOTAL  --   --  8137*  --   --   --  3734*  --  1833*  TROPONINI 0.23*  --  0.19*  --  0.18*  --   --   --   --   < > = values in this interval not displayed.  Estimated Creatinine Clearance: 7.5 mL/min (by C-G formula based on Cr of 6.51).   Assessment: [age] yof with new onset atrial fibrillation with acute embolus to right lower extremity. Initially started on heparin on 4/25 that was stopped due R femoral embolectomy and 4 compartment fasciotomy.  Pharmacy has been managing heparin for afib. Warfarin started 5/1  -Heparin level at goal (HL= 0.50)  -INR unchanged after 1 dose of warfarin at 1.2 -Hgb= 10 (s/p PRBC 4/29), plt= 132   Goal of Therapy:  INR goal 2-3 Heparin level 0.3-0.7 units/ml Monitor platelets by anticoagulation protocol: Yes   Plan:  -Continue heparin at 1600  units/hr -Continue Warfarin at low dose 2.5mg  tonight  Sheppard CoilFrank Roseline Ebarb PharmD., BCPS Clinical Pharmacist Pager [phone] 02/13/2016 11:12 AM

## 2016-02-13 NOTE — Progress Notes (Signed)
Patient Profile: 82 y/o female with dementia but no prior cardiac history admitted for acute critical limb ischemia secondary to acute right LE arterial occlusion. Found to be in new onset atrial fibrillation w/ RVR.   Subjective: Feels ok. Out of bed and in a chair. No dyspnea.   Objective: Vital signs in last 24 hours: Temp:  [97.5 F (36.4 C)-98.4 F (36.9 C)] 98 F (36.7 C) (05/02 1145) Pulse Rate:  [66-103] 103 (05/02 1200) Resp:  [11-20] 13 (05/02 1200) BP: (93-122)/(56-86) 108/72 mmHg (05/02 1200) SpO2:  [88 %-97 %] 94 % (05/02 1200) Weight:  [222 lb 0.1 oz (100.7 kg)] 222 lb 0.1 oz (100.7 kg) (05/02 0500) Last BM Date: 02/12/16  Intake/Output from previous day: 05/01 0701 - 05/02 0700 In: 2900.9 [I.V.:2780.9; IV Piggyback:120] Out: 2325 [Urine:2075; Drains:250] Intake/Output this shift: Total I/O In: 730 [I.V.:580; IV Piggyback:150] Out: 825 [Urine:825]  Medications Current Facility-Administered Medications  Medication Dose Route Frequency Provider Last Rate Last Dose  . acetaminophen (TYLENOL) tablet 325-650 mg  325-650 mg Oral Q4H PRN Raymond Gurney, PA-C       Or  . acetaminophen (TYLENOL) suppository 325-650 mg  325-650 mg Rectal Q4H PRN Raymond Gurney, PA-C      . alum & mag hydroxide-simeth (MAALOX/MYLANTA) 200-200-20 MG/5ML suspension 15-30 mL  15-30 mL Oral Q2H PRN Raymond Gurney, PA-C      . amiodarone (PACERONE) tablet 400 mg  400 mg Oral BID Othella Boyer, MD   400 mg at 02/13/16 0857  . cyanocobalamin ((VITAMIN B-12)) injection 2,000 mcg  2,000 mcg Intramuscular Q30 days Raymond Gurney, PA-C   2,000 mcg at 02/12/16 1030  . dextrose 5 % 1,000 mL with sodium acetate 150 mEq infusion   Intravenous Continuous Terrial Rhodes, MD 100 mL/hr at 02/13/16 1200    . docusate sodium (COLACE) capsule 100 mg  100 mg Oral Daily Raymond Gurney, PA-C   100 mg at 02/11/16 0748  . donepezil (ARICEPT) tablet 10 mg  10 mg Oral QHS Raymond Gurney, PA-C    10 mg at 02/12/16 2145  . feeding supplement (ENSURE ENLIVE) (ENSURE ENLIVE) liquid 237 mL  237 mL Oral TID BM Ailene Ards, RD      . ferrous sulfate tablet 325 mg  325 mg Oral Q breakfast Raymond Gurney, PA-C   325 mg at 02/12/16 0754  . Gerhardt's butt cream   Topical Continuous PRN Chuck Hint, MD      . guaiFENesin-dextromethorphan [facility] DM) 100-10 MG/5ML syrup 15 mL  15 mL Oral Q4H PRN Raymond Gurney, PA-C      . haloperidol (HALDOL) tablet 0.5 mg  0.5 mg Oral BID PRN Raymond Gurney, PA-C      . heparin ADULT infusion 100 units/mL (25000 units/250 mL)  1,600 Units/hr Intravenous Continuous Earnie Larsson, RPH 16 mL/hr at 02/13/16 1200 1,600 Units/hr at 02/13/16 1200  . heparin injection 1,000-6,000 Units  1,000-6,000 Units CRRT PRN Cleone Slim, MD   2,400 Units at 02/10/16 [phone]  . hydrALAZINE (APRESOLINE) injection 5 mg  5 mg Intravenous Q20 Min PRN Raymond Gurney, PA-C      . levothyroxine (SYNTHROID, LEVOTHROID) tablet 75 mcg  75 mcg Oral QAC breakfast Raymond Gurney, PA-C   75 mcg at 02/12/16 0754  . lidocaine (XYLOCAINE) 2 % injection 10 mL  10 mL Intradermal Once Cleone Slim, MD      . metoprolol [facility])  injection 2.5-5 mg  2.5-5 mg Intravenous Q3H PRN Merwyn Katos, MD      . morphine 2 MG/ML injection 1-3 mg  1-3 mg Intravenous Q1H PRN Raymond Gurney, PA-C   2 mg at 02/07/16 0756  . ondansetron (ZOFRAN) injection 4 mg  4 mg Intravenous Q6H PRN Raymond Gurney, PA-C   4 mg at 02/09/16 1201  . oxyCODONE-acetaminophen (PERCOCET/ROXICET) 5-325 MG per tablet 1-2 tablet  1-2 tablet Oral Q4H PRN Raymond Gurney, PA-C   1 tablet at 02/12/16 2137  . pantoprazole (PROTONIX) EC tablet 40 mg  40 mg Oral Daily Raymond Gurney, PA-C   40 mg at 02/12/16 1038  . phenol (CHLORASEPTIC) mouth spray 1 spray  1 spray Mouth/Throat PRN Ranae Plumber Trinh, PA-C      . potassium chloride 10 mEq in 50 mL *CENTRAL LINE* IVPB  10 mEq Intravenous Q1 Hr x 6 Alyssa  A Haney, MD   10 mEq at 02/13/16 1336  . potassium chloride SA (K-DUR,KLOR-CON) CR tablet 20 mEq  20 mEq Oral Once Chuck Hint, MD   20 mEq at 02/13/16 0830  . sodium chloride 0.9 % bolus 250 mL  250 mL Intravenous Once Richarda Overlie, MD      . sodium chloride 0.9 % bolus 250 mL  250 mL Intravenous Q6H PRN Richarda Overlie, MD      . warfarin (COUMADIN) tablet 2.5 mg  2.5 mg Oral ONCE-1800 Earnie Larsson, [facility]      . Warfarin - Pharmacist Dosing Inpatient   Does not apply q1800 Earnie Larsson, RPH   0  at 02/12/16 1800    PE: General appearance: alert, cooperative, no distress, moderately obese and elderly Neck: no carotid bruit and no JVD Lungs: clear to auscultation bilaterally Heart: irregularly irregular rhythm Extremities: 1+ ankle edema bilaterally Pulses: 2+ and symmetric Skin: warm and dry Neurologic: Grossly normal  Lab Results:   Recent Labs  02/11/16 0605 02/12/16 0320 02/13/16 0558  WBC 6.2 5.9 6.4  HGB 10.6* 10.0* 10.3*  HCT 30.4* 29.4* 31.0*  PLT 100* 111* 132*   BMET  Recent Labs  02/11/16 0605 02/12/16 0500 02/13/16 0558  NA 135 136 135  K 3.6 3.1* 2.7*  CL 94* 90* 83*  CO2 29 33* 40*  GLUCOSE 107* 107* 102*  BUN 37* 37* 36*  CREATININE 6.56* 6.53* 6.51*  CALCIUM 7.8* 7.9* 7.8*   PT/INR  Recent Labs  02/13/16 0558  LABPROT 15.9*  INR 1.26     Assessment/Plan  Principal Problem:   Critical lower limb ischemia Active Problems:   Atrial fibrillation with RVR (HCC)   Ischemia of lower extremity   Acute systolic CHF (congestive heart failure) (HCC)   Elevated troponin   Hypotension   Acute post-hemorrhagic anemia   Anuria   AKI (acute kidney injury) (HCC)   1. PAF: remains in afib with CVR. Continue Amiodarone and Coumadin with heparin bridge. INR is sub therapeutic at 1.26. Goal is 2-3.   2. Systemic Embolization Secondary to Atrial Fibrillation: s/p emergent right femoral artery embolectomy per Dr. Edilia Bo. Continue post op  management per vascular surgery.   3. Hypokalemia: K is 2.7 today. Supplemental K ordered IV by primary. Will check Mg level.    4. LV dysfunction: EF 40-45%. Not a candidate for ACE/ARB given AKI with Scr ~6.  5. AKI: SCr abnormal at 6.5. Was in the 2 range a year ago. Now nonoliguric with brisk response to furosemide.  Dr. Lowell GuitarPowell following. Holding off on HD for now, per Dr. Lowell GuitarPowell.     LOS: 7 days    Brittainy M. Delmer IslamSimmons, PA-C 02/13/2016 2:38 PM  The patient has been seen in conjunction with Robbie LisBrittainy Simmons, PAC. All aspects of care have been considered and discussed. The patient has been personally interviewed, examined, and all clinical data has been reviewed.   Agree with the note as outlined above. Long-term anticoagulation is a must.

## 2016-02-13 NOTE — [place] FL2 (Signed)
[place] MEDICAID FL2 LEVEL OF CARE SCREENING TOOL     IDENTIFICATION  Patient Name: Valerie NettlesBetty Cordova Birthdate: 1932-03-27 Sex: female Admission Date (Current Location): 01/17/2016  [facility] [place]   Facility and Address:  [facility], [address], [place], [place] [address]      Provider Number: [id number]  Attending Physician Name and Address:  Chuck Hinthristopher S Dickson, MD  Relative Name and Phone Number:       Current Level of Care: Hospital Recommended Level of Care: Skilled Nursing Facility Prior Approval Number:    Date Approved/Denied:   PASRR Number: [id number]  Discharge Plan: SNF    Current Diagnoses: Patient Active Problem List   Diagnosis Date Noted  . AKI (acute kidney injury) (HCC)   . Hypotension 02/08/2016  . Acute post-hemorrhagic anemia 02/08/2016  . Anuria   . Acute systolic CHF (congestive heart failure) (HCC) 02/07/2016  . Elevated troponin 02/07/2016  . Critical lower limb ischemia 01/17/2016  . Atrial fibrillation with RVR (HCC) 01/18/2016  . Ischemia of lower extremity 02/09/2016    Orientation RESPIRATION BLADDER Height & Weight     Self  O2 (2L [place]) Incontinent, Indwelling catheter Weight: 222 lb 0.1 oz (100.7 kg) Height:  5\' 2"  (157.5 cm)  BEHAVIORAL SYMPTOMS/MOOD NEUROLOGICAL BOWEL NUTRITION STATUS      Incontinent Diet (regular)  AMBULATORY STATUS COMMUNICATION OF NEEDS Skin   Extensive Assist Verbally Surgical wounds, Wound Vac (wound vac on leg- will discharge with vac change MWF)                       Personal Care Assistance Level of Assistance  Bathing, Dressing Bathing Assistance: Maximum assistance   Dressing Assistance: Maximum assistance     Functional Limitations Info             SPECIAL CARE FACTORS FREQUENCY  PT (By licensed PT), OT (By licensed OT)     PT Frequency: 5/wk OT Frequency: 5/wk            Contractures      Additional Factors Info   Code Status, Allergies Code Status Info: FULL Allergies Info: Codeine, Vicodin           Current Medications (02/13/2016):  This is the current hospital active medication list Current Facility-Administered Medications  Medication Dose Route Frequency Provider Last Rate Last Dose  . acetaminophen (TYLENOL) tablet 325-650 mg  325-650 mg Oral Q4H PRN Raymond GurneyKimberly A Trinh, PA-C       Or  . acetaminophen (TYLENOL) suppository 325-650 mg  325-650 mg Rectal Q4H PRN Raymond GurneyKimberly A Trinh, PA-C      . alum & mag hydroxide-simeth (MAALOX/MYLANTA) 200-200-20 MG/5ML suspension 15-30 mL  15-30 mL Oral Q2H PRN Raymond GurneyKimberly A Trinh, PA-C      . amiodarone (PACERONE) tablet 400 mg  400 mg Oral BID Othella BoyerWilliam S Tilley, MD   400 mg at 02/13/16 0857  . cyanocobalamin ((VITAMIN B-12)) injection 2,000 mcg  2,000 mcg Intramuscular Q30 days Raymond GurneyKimberly A Trinh, PA-C   2,000 mcg at 02/12/16 1030  . dextrose 5 % 1,000 mL with sodium acetate 150 mEq infusion   Intravenous Continuous Terrial RhodesJoseph Coladonato, MD 100 mL/hr at 02/13/16 1500    . docusate sodium (COLACE) capsule 100 mg  100 mg Oral Daily Raymond GurneyKimberly A Trinh, PA-C   100 mg at 02/11/16 0748  . donepezil (ARICEPT) tablet 10 mg  10 mg Oral QHS Raymond GurneyKimberly A Trinh, PA-C  10 mg at 02/12/16 2145  . feeding supplement (ENSURE ENLIVE) (ENSURE ENLIVE) liquid 237 mL  237 mL Oral TID BM Ailene Ards, RD   237 mL at 02/13/16 1400  . ferrous sulfate tablet 325 mg  325 mg Oral Q breakfast Raymond Gurney, PA-C   325 mg at 02/12/16 0754  . Gerhardt's butt cream   Topical Continuous PRN Chuck Hint, MD      . guaiFENesin-dextromethorphan [facility] DM) 100-10 MG/5ML syrup 15 mL  15 mL Oral Q4H PRN Raymond Gurney, PA-C      . haloperidol (HALDOL) tablet 0.5 mg  0.5 mg Oral BID PRN Raymond Gurney, PA-C      . heparin ADULT infusion 100 units/mL (25000 units/250 mL)  1,600 Units/hr Intravenous Continuous Earnie Larsson, RPH 16 mL/hr at 02/13/16 1200 1,600 Units/hr at 02/13/16 1200   . heparin injection 1,000-6,000 Units  1,000-6,000 Units CRRT PRN Cleone Slim, MD   2,400 Units at 02/10/16 [id number]  . hydrALAZINE (APRESOLINE) injection 5 mg  5 mg Intravenous Q20 Min PRN Raymond Gurney, PA-C      . levothyroxine (SYNTHROID, LEVOTHROID) tablet 75 mcg  75 mcg Oral QAC breakfast Raymond Gurney, PA-C   75 mcg at 02/12/16 0754  . lidocaine (XYLOCAINE) 2 % injection 10 mL  10 mL Intradermal Once Cleone Slim, MD      . metoprolol [facility]) injection 2.5-5 mg  2.5-5 mg Intravenous Q3H PRN Merwyn Katos, MD      . morphine 2 MG/ML injection 1-3 mg  1-3 mg Intravenous Q1H PRN Raymond Gurney, PA-C   2 mg at 02/07/16 0756  . ondansetron (ZOFRAN) injection 4 mg  4 mg Intravenous Q6H PRN Raymond Gurney, PA-C   4 mg at 02/09/16 1201  . oxyCODONE-acetaminophen (PERCOCET/ROXICET) 5-325 MG per tablet 1-2 tablet  1-2 tablet Oral Q4H PRN Raymond Gurney, PA-C   1 tablet at 02/12/16 2137  . pantoprazole (PROTONIX) EC tablet 40 mg  40 mg Oral Daily Raymond Gurney, PA-C   40 mg at 02/12/16 1038  . phenol (CHLORASEPTIC) mouth spray 1 spray  1 spray Mouth/Throat PRN Ranae Plumber Trinh, PA-C      . potassium chloride 10 mEq in 50 mL *CENTRAL LINE* IVPB  10 mEq Intravenous Q1 Hr x 6 Alyssa A Haney, MD   10 mEq at 02/13/16 1505  . potassium chloride SA (K-DUR,KLOR-CON) CR tablet 20 mEq  20 mEq Oral Once Chuck Hint, MD   20 mEq at 02/13/16 0830  . sodium chloride 0.9 % bolus 250 mL  250 mL Intravenous Once Richarda Overlie, MD      . sodium chloride 0.9 % bolus 250 mL  250 mL Intravenous Q6H PRN Richarda Overlie, MD      . warfarin (COUMADIN) tablet 2.5 mg  2.5 mg Oral ONCE-1800 Earnie Larsson, [facility]      . Warfarin - Pharmacist Dosing Inpatient   Does not apply q1800 Earnie Larsson, [facility]   0  at 02/12/16 1800     Discharge Medications: Please see discharge summary for a list of discharge medications.  Relevant Imaging Results:  Relevant Lab Results:   Additional  Information SS#: [id number]  Izora Ribas, [place]

## 2016-02-13 NOTE — Progress Notes (Signed)
Subjective:    No concerns today, reports improved right leg pain  Objective Vital signs in last 24 hours: Filed Vitals:   02/13/16 0500 02/13/16 0600 02/13/16 0700 02/13/16 0732  BP:  107/67 107/71   Pulse:  68 77   Temp:    97.8 F (36.6 C)  TempSrc:    Oral  Resp:  15 13   Height:      Weight: 100.7 kg (222 lb 0.1 oz)     SpO2:  95% 95%    Weight change: 1.7 kg (3 lb 12 oz)  Intake/Output Summary (Last 24 hours) at 02/13/16 0801 Last data filed at 02/13/16 0700  Gross per 24 hour  Intake 2785.87 ml  Output   2225 ml  Net 560.87 ml   UOP over last 12 hours- 1.2 ml/kg/hr  Labs: Basic Metabolic Panel:  Recent Labs Lab 02/11/16 0605 02/12/16 0500 02/13/16 0558  NA 135 136 135  K 3.6 3.1* 2.7*  CL 94* 90* 83*  CO2 29 33* 40*  GLUCOSE 107* 107* 102*  BUN 37* 37* 36*  CREATININE 6.56* 6.53* 6.51*  CALCIUM 7.8* 7.9* 7.8*  PHOS 5.5* 4.7* 4.2   Liver Function Tests:  Recent Labs Lab 02/07/16 2059 02/09/16 1206  02/11/16 0605 02/12/16 0500 02/13/16 0558  AST 395* 340*  --   --   --   --   ALT 96* 97*  --   --   --   --   ALKPHOS 34* 40  --   --   --   --   BILITOT 0.5 1.4*  --   --   --   --   PROT 4.1* 4.6*  --   --   --   --   ALBUMIN 2.4* 2.5*  < > 2.2* 2.2* 2.2*  < > = values in this interval not displayed. No results for input(s): LIPASE, AMYLASE in the last 168 hours. No results for input(s): AMMONIA in the last 168 hours. CBC:  Recent Labs Lab 02/09/16 0618  02/10/16 0309 02/11/16 0605 02/12/16 0320 02/13/16 0558  WBC 7.2  --  6.0 6.2 5.9 6.4  HGB 8.9*  < > 8.1* 10.6* 10.0* 10.3*  HCT 26.8*  < > 25.0* 30.4* 29.4* 31.0*  MCV 90.8  --  91.9 89.9 90.5 92.8  PLT 100*  < > 90* 100* 111* 132*  < > = values in this interval not displayed. Cardiac Enzymes:  Recent Labs Lab 02/09/16 1206  02/10/16 0308 02/10/16 1130 02/10/16 2128 02/11/16 0605 02/11/16 1126 02/12/16 0320 02/13/16 0558  CKTOTAL [id number]*  --   --  15006*  --  8137*  --   3734* 1833*  TROPONINI 0.36*  < > 0.30* 0.28* 0.23* 0.19* 0.18*  --   --   < > = values in this interval not displayed. CBG:  Recent Labs Lab Apr 11, 2016 1750  GLUCAP 194*    Iron Studies: No results for input(s): IRON, TIBC, TRANSFERRIN, FERRITIN in the last 72 hours. Studies/Results: Dg Chest Port 1 View  02/11/2016  CLINICAL DATA:  82 year old female with history of pleural effusion and congestive heart failure. EXAM: PORTABLE CHEST 1 VIEW COMPARISON:  Chest x-ray 02/09/2016. FINDINGS: There is a right-sided internal jugular central venous catheter with tip terminating in the mid superior vena cava. Lung volumes are low. There are patchy interstitial and airspace opacities asymmetrically distributed throughout the lungs bilaterally, concerning for multilobar bronchopneumonia. Bibasilar opacities likely reflect subsegmental atelectasis. No cephalization of the pulmonary  vasculature. Small bilateral pleural effusions. Mild cardiomegaly. The patient is rotated to the left on today's exam, resulting in distortion of the mediastinal contours and reduced diagnostic sensitivity and specificity for mediastinal pathology. Atherosclerosis in the thoracic aorta. Status post vertebroplasty of a mid thoracic vertebral body. IMPRESSION: 1. Support apparatus, as above. 2. Findings remain concerning for multilobar bronchopneumonia, overall with aeration very similar to the prior study. 3. Low lung volumes with probable bibasilar subsegmental atelectasis. 4. Mild cardiomegaly. 5. Atherosclerosis. Electronically Signed   By: Trudie Reed M.D.   On: 02/11/2016 10:31   Medications: Infusions: . dextrose 5 % 1,000 mL with sodium acetate 150 mEq infusion 100 mL/hr at 02/13/16 0300  . Gerhardt's butt cream    . heparin 1,600 Units/hr (02/12/16 2013)    Scheduled Medications: . amiodarone  400 mg Oral BID  . cyanocobalamin  2,000 mcg Intramuscular Q30 days  . docusate sodium  100 mg Oral Daily  . donepezil   10 mg Oral QHS  . ferrous sulfate  325 mg Oral Q breakfast  . furosemide  100 mg Intravenous Q8H  . levothyroxine  75 mcg Oral QAC breakfast  . lidocaine  10 mL Intradermal Once  . pantoprazole  40 mg Oral Daily  . patient's guide to using coumadin book   Does not apply Once  . potassium chloride  20 mEq Oral Once  . sodium chloride  250 mL Intravenous Once  . warfarin   Does not apply Once  . Warfarin - Pharmacist Dosing Inpatient   Does not apply q1800    have reviewed scheduled and prn medications.  Physical Exam: General: awake, alert, NAD, pleasant Heart: RRR, no murmurs Lungs: CTAB, on Room air Abdomen: obese, soft, NT/ND, +BS Extremities: RLE with +1 edema to ankle (calf wrapped with wound vac in place), LLE with +1 edema Neuro: AOx1 (self only), cooperative Dialysis Access:  R IJ    Assessment/ Plan: Valerie Cordova is a 82 y.o. Female admitted on 4/25 for RLE ischemia in setting of  new onset Afib with RVR. PMH sig for dementia. She is s/p R femoral embolectomy, vein patch angioplasty and 4 compartment fasciotomy. Baseline renal function unknown.   1.Renal- AKI in the setting of hypotension/ rhabdomyolysis/ decreased renal perfusion. Likely has renal artery stenosis, given atrophic right kidney on renal u/s 4/26. UOP 2075 cc over last 24. Daily RFP.  CK 1833.  Cr 6.51. K 2.7. Phos 4.2  FENa 5.66%. U eosinophils pending. SPEP, UPEP pending. Renal artery doppler inconclusive. Wt 222 lb from 183lb on admission. Will d/c NS. Lasix diuresis as tolerated. Replete K as needed 2. Hypertension/volume - hypotension resolved  3. New onset Afib- NSR now. On Heparin gtt. Management per Cards 4. Anemia - Hgb 10.0 5. Dementia - Aricept per primary 6. Acute combined systolic/diastolic CHF 7. Hypothyroidism  Improving UOP, Cr, CK. Continue to hold HD for now. Continue to monitor closely for fluid overload espcially as weight is increasing. Maintain HD cath for now, d/c IVF, watch  UOP.     Alyssa A. Haney MD, MS  Renal Attending: I agree with assessment above. Crea 2.28 in April.  Now nonoliguric with brisk response to furosemide.  Will follow without dialysis. Lauris Poag  Family Medicine Resident PGY-2 Pager [phone]

## 2016-02-13 NOTE — Progress Notes (Signed)
02/13/2016 1645 Received pt to 2w14 from 2S.  Pt is alert to self only.  She is without c/o at this time.  Tele monitor applied and CCMD notified.  Oriented to room, call light and bed.  Pt is up in recliner now, does not want to get back in bed yet.  Call bell in reach.   Kathryne HitchAllen, Kashonda Sarkisyan C

## 2016-02-13 NOTE — Progress Notes (Signed)
Patient tx to 2W14 in recliner, receiving RN in room and patient placed on telemetry, patient has 2 more runs of K to give-RN aware, SCDs at bedside, Gerhardts butt cream at bedside, no further questions from patient or receiving RN.  Hermina BartersBOWMAN, Jimi Giza M, RN

## 2016-02-14 ENCOUNTER — Inpatient Hospital Stay (HOSPITAL_COMMUNITY): Payer: Medicare (Managed Care)

## 2016-02-14 LAB — CBC
HEMATOCRIT: 29.9 % — AB (ref 36.0–46.0)
Hemoglobin: 10.1 g/dL — ABNORMAL LOW (ref 12.0–15.0)
MCH: 31.8 pg (ref 26.0–34.0)
MCHC: 33.8 g/dL (ref 30.0–36.0)
MCV: 94 fL (ref 78.0–100.0)
Platelets: 150 10*3/uL (ref 150–400)
RBC: 3.18 MIL/uL — ABNORMAL LOW (ref 3.87–5.11)
RDW: 14.9 % (ref 11.5–15.5)
WBC: 8.6 10*3/uL (ref 4.0–10.5)

## 2016-02-14 LAB — RENAL FUNCTION PANEL
Albumin: 2.4 g/dL — ABNORMAL LOW (ref 3.5–5.0)
Anion gap: 13 (ref 5–15)
BUN: 32 mg/dL — ABNORMAL HIGH (ref 6–20)
CHLORIDE: 85 mmol/L — AB (ref 101–111)
CO2: 39 mmol/L — ABNORMAL HIGH (ref 22–32)
Calcium: 8 mg/dL — ABNORMAL LOW (ref 8.9–10.3)
Creatinine, Ser: 6.41 mg/dL — ABNORMAL HIGH (ref 0.44–1.00)
GFR, EST AFRICAN AMERICAN: 6 mL/min — AB (ref >60)
GFR, EST NON AFRICAN AMERICAN: 5 mL/min — AB (ref >60)
Glucose, Bld: 119 mg/dL — ABNORMAL HIGH (ref 65–99)
POTASSIUM: 3.6 mmol/L (ref 3.5–5.1)
Phosphorus: 3.3 mg/dL (ref 2.5–4.6)
Sodium: 137 mmol/L (ref 135–145)

## 2016-02-14 LAB — PROTIME-INR
INR: 1.3 (ref 0.00–1.49)
PROTHROMBIN TIME: 16.3 s — AB (ref 11.6–15.2)

## 2016-02-14 LAB — CK: CK TOTAL: 1435 U/L — AB (ref 38–234)

## 2016-02-14 LAB — HEPARIN LEVEL (UNFRACTIONATED): HEPARIN UNFRACTIONATED: 0.59 [IU]/mL (ref 0.30–0.70)

## 2016-02-14 MED ORDER — MAGNESIUM SULFATE IN D5W 1-5 GM/100ML-% IV SOLN
1.0000 g | Freq: Once | INTRAVENOUS | Status: AC
  Administered 2016-02-14: 1 g via INTRAVENOUS
  Filled 2016-02-14: qty 100

## 2016-02-14 MED ORDER — WARFARIN SODIUM 4 MG PO TABS
4.0000 mg | ORAL_TABLET | Freq: Once | ORAL | Status: DC
  Filled 2016-02-14: qty 1

## 2016-02-14 MED ORDER — FUROSEMIDE 10 MG/ML IJ SOLN
160.0000 mg | Freq: Two times a day (BID) | INTRAVENOUS | Status: DC
  Administered 2016-02-14 – 2016-02-15 (×2): 160 mg via INTRAVENOUS
  Filled 2016-02-14 (×4): qty 16

## 2016-02-14 MED ORDER — WHITE PETROLATUM GEL
Status: DC | PRN
  Filled 2016-02-14: qty 28.35
  Filled 2016-02-14: qty 1

## 2016-02-14 NOTE — Progress Notes (Signed)
Pt arousable and responsive however not take PO pills.  Pt acknowleges instruction and accepts meds into mouth but then spits out.  Will try again later.  Concern only for Amiodarone.

## 2016-02-14 NOTE — Progress Notes (Signed)
Pt has converted over to NSR. She is in no distress and resting comfortably. Will closely monitor pt.

## 2016-02-14 NOTE — Clinical Social Work Note (Signed)
Per daughter's request CSW [facility].   Valero EnergyShonny Maykel Reitter, LCSW [phone]

## 2016-02-14 NOTE — Progress Notes (Signed)
Utilization review completed.  

## 2016-02-14 NOTE — Progress Notes (Addendum)
[facility] Progress Note  VASCULAR SURGERY ASSESSMENT AND PLAN:   Status post right femoral embolectomy and vein patch angioplasty with 4 compartment fasciotomy.  Given the swelling [facility] If [facility] is discharged to a skilled nursing facility I can follow this wound as an outpatient.   PAF: On heparin and Coumadin per pharmacy. Nephrology is following.   RENAL: Renal function is stable. Nephrology is following.  I have interviewed the patient and examined the patient. I agree with the findings by the PA.  Cari Carawayhris Robbi Scurlock, MD [phone]   Subjective  - POD #8  Denies pain in right foot.   Objective Filed Vitals:   02/13/16 1939 02/14/16 0530  BP: 131/45 122/65  Pulse: 65 79  Temp: 98.9 F (37.2 C) 98 F (36.7 C)  Resp: 18 18    Intake/Output Summary (Last 24 hours) at 02/14/16 0755 Last data filed at 02/14/16 0602  Gross per 24 hour  Intake   1130 ml  Output   1700 ml  Net   -570 ml   Resting in bed in NAD Right groin incision clean Right leg swollen with ecchymosis of lower leg. Bloody drainage on anterior leg dressing. Slow ooze from single spot on anterior incision. Wound VAC seals intact.  Motor function intact right foot Right foot is warm and well perfused.   Assessment/Planning: [age] y.o. female is s/p: right femoral embolectomy, vein patch angioplasty of the femoral artery and 4 compartment fasciotomy 8 Days Post-Op   S/p embolectomy: foot appears warm and well perfused with intact motor function. Right leg is still swollen. For VAC change today.  Afib with RVR: in NSR today. On heparin bridge to coumadin. INR 1.3 today.  Hypokalemia: 3.6 today. Given 20 mEq x 1 yesterday. Hypomagnesemia: D/w pharmacy. Will give 1 g. Recheck Mg tomorrow.   Appreciate cardiology and nephrology services following.  Raymond GurneyKimberly A Trinh 02/14/2016 7:55 AM --  Laboratory CBC    Component Value Date/Time   WBC 8.6 02/14/2016  0311   HGB 10.1* 02/14/2016 0311   HCT 29.9* 02/14/2016 0311   PLT 150 02/14/2016 0311    BMET    Component Value Date/Time   NA 137 02/14/2016 0311   K 3.6 02/14/2016 0311   CL 85* 02/14/2016 0311   CO2 39* 02/14/2016 0311   GLUCOSE 119* 02/14/2016 0311   BUN 32* 02/14/2016 0311   CREATININE 6.41* 02/14/2016 0311   CALCIUM 8.0* 02/14/2016 0311   GFRNONAA 5* 02/14/2016 0311   GFRAA 6* 02/14/2016 0311    COAG Lab Results  Component Value Date   INR 1.30 02/14/2016   INR 1.26 02/13/2016   INR 1.27 02/09/2016   No results found for: PTT  Antibiotics Anti-infectives    Start     Dose/Rate Route Frequency Ordered Stop   02/07/16 1800  piperacillin-tazobactam (ZOSYN) IVPB 2.25 g  Status:  Discontinued     2.25 g 100 mL/hr over 30 Minutes Intravenous Every 8 hours 02/07/16 1721 02/10/16 1102   02/07/16 1800  vancomycin (VANCOCIN) IVPB 1000 mg/200 mL premix  Status:  Discontinued     1,000 mg 200 mL/hr over 60 Minutes Intravenous Every 48 hours 02/07/16 1755 02/08/16 0900   11-01-2015 2200  sulfamethoxazole-trimethoprim (BACTRIM DS,SEPTRA DS) 800-160 MG per tablet 2 tablet  Status:  Discontinued     2 tablet Oral 2 times daily 11-01-2015 2032 02/07/16 0812   11-01-2015 2130  cefUROXime (ZINACEF) 1.5 g in dextrose  5 % 50 mL IVPB     1.5 g 100 mL/hr over 30 Minutes Intravenous Every 12 hours 01/16/2016 2032 02/07/16 0900   02/10/2016 1345  ceFAZolin (ANCEF) IVPB 1 g/50 mL premix    Comments:  Send with pt to OR   1 g 100 mL/hr over 30 Minutes Intravenous On call 01/28/2016 1339 02/11/2016 1520       Maris Berger, PA-C [facility] Office: [phone] Pager: [phone] 02/14/2016 7:55 AM

## 2016-02-14 NOTE — Progress Notes (Signed)
Subjective:    No concerns today, reports improved right leg pain  Objective Vital signs in last 24 hours: Filed Vitals:   02/13/16 1650 02/13/16 1939 02/14/16 0530 02/14/16 0600  BP: 104/61 131/45 122/65   Pulse: 73 65 79   Temp: 98.9 F (37.2 C) 98.9 F (37.2 C) 98 F (36.7 C)   TempSrc:  Oral Axillary   Resp: 17 18 18    Height:      Weight:    105.4 kg (232 lb 5.8 oz)  SpO2: 94% 91% 93%    Weight change: 4.7 kg (10 lb 5.8 oz)  Intake/Output Summary (Last 24 hours) at 02/14/16 0836 Last data filed at 02/14/16 0602  Gross per 24 hour  Intake   1014 ml  Output   1425 ml  Net   -411 ml   UOP over last 12 hours- 1.2 ml/kg/hr  Labs: Basic Metabolic Panel:  Recent Labs Lab 02/12/16 0500 02/13/16 0558 02/14/16 0311  NA 136 135 137  K 3.1* 2.7* 3.6  CL 90* 83* 85*  CO2 33* 40* 39*  GLUCOSE 107* 102* 119*  BUN 37* 36* 32*  CREATININE 6.53* 6.51* 6.41*  CALCIUM 7.9* 7.8* 8.0*  PHOS 4.7* 4.2 3.3   Liver Function Tests:  Recent Labs Lab 02/07/16 2059 02/09/16 1206  02/12/16 0500 02/13/16 0558 02/14/16 0311  AST 395* 340*  --   --   --   --   ALT 96* 97*  --   --   --   --   ALKPHOS 34* 40  --   --   --   --   BILITOT 0.5 1.4*  --   --   --   --   PROT 4.1* 4.6*  --   --   --   --   ALBUMIN 2.4* 2.5*  < > 2.2* 2.2* 2.4*  < > = values in this interval not displayed. No results for input(s): LIPASE, AMYLASE in the last 168 hours. No results for input(s): AMMONIA in the last 168 hours. CBC:  Recent Labs Lab 02/10/16 0309 02/11/16 0605 02/12/16 0320 02/13/16 0558 02/14/16 0311  WBC 6.0 6.2 5.9 6.4 8.6  HGB 8.1* 10.6* 10.0* 10.3* 10.1*  HCT 25.0* 30.4* 29.4* 31.0* 29.9*  MCV 91.9 89.9 90.5 92.8 94.0  PLT 90* 100* 111* 132* 150   Cardiac Enzymes:  Recent Labs Lab 02/09/16 1206  02/10/16 0308 02/10/16 1130 02/10/16 2128 02/11/16 0605 02/11/16 1126 02/12/16 0320 02/13/16 0558  CKTOTAL [id number]*  --   --  15006*  --  8137*  --  3734* 1833*   TROPONINI 0.36*  < > 0.30* 0.28* 0.23* 0.19* 0.18*  --   --   < > = values in this interval not displayed. CBG: No results for input(s): GLUCAP in the last 168 hours.  Iron Studies: No results for input(s): IRON, TIBC, TRANSFERRIN, FERRITIN in the last 72 hours. Studies/Results: No results found. Medications: Infusions: . dextrose 5 % 1,000 mL with sodium acetate 150 mEq infusion 100 mL/hr at 02/13/16 1500  . Gerhardt's butt cream    . heparin 1,600 Units/hr (02/14/16 0458)    Scheduled Medications: . amiodarone  400 mg Oral BID  . cyanocobalamin  2,000 mcg Intramuscular Q30 days  . docusate sodium  100 mg Oral Daily  . donepezil  10 mg Oral QHS  . feeding supplement (ENSURE ENLIVE)  237 mL Oral TID BM  . ferrous sulfate  325 mg Oral Q breakfast  .  levothyroxine  75 mcg Oral QAC breakfast  . lidocaine  10 mL Intradermal Once  . magnesium sulfate 1 - 4 g bolus IVPB  1 g Intravenous Once  . pantoprazole  40 mg Oral Daily  . potassium chloride  20 mEq Oral Once  . sodium chloride  250 mL Intravenous Once  . Warfarin - Pharmacist Dosing Inpatient   Does not apply q1800    have reviewed scheduled and prn medications.  Physical Exam: General: awake, alert, NAD, pleasant Heart: RRR, no murmurs Lungs: CTAB, on Room air Abdomen: obese, soft, NT/ND, +BS Extremities: RLE with +1 edema to ankle (calf wrapped with wound vac in place), LLE with +1 edema Neuro: AOx1 (self only), cooperative Dialysis Access:  R IJ    Assessment/ Plan: Valerie Cordova is a 82 y.o. Female admitted on 4/25 for RLE ischemia in setting of  new onset Afib with RVR. PMH sig for dementia. She is s/p R femoral embolectomy, vein patch angioplasty and 4 compartment fasciotomy. Baseline renal function unknown.   1.Renal- AKI in the setting of hypotension/ rhabdomyolysis/ decreased renal perfusion. Likely has renal artery stenosis, given atrophic right kidney on renal u/s 4/26. UOP 2075 cc over last 24. Daily  RFP.   Cr 6.41. K 3.6. Phos 4.2  FENa 5.66%. Renal artery doppler inconclusive. Wt 222 lb from 232 lb on admission. Will d/c NS. S/p Lasix diuresis with more than adequate response 2. Hypertension/volume - hypotension resolved  3. New onset Afib- NSR now. On Heparin gtt. Management per Cards 4. Anemia - Hgb 10.0 5. Dementia - Aricept per primary 6. Acute combined systolic/diastolic CHF 7. Hypothyroidism  Metabolic alkalosis in setting of sodium acetate administration. Will d/c sodium acetate now. Improving UOP, Cr, CK. Continue to hold HD for now. Maintain HD cath for now, d/c IVF, watch UOP.    Alyssa A. Kennon Rounds MD, MS Family Medicine Resident PGY-2 Pager [phone]   Renal Attending: She is volume overloaded and will need diuresis.  We may not be able to avoid dialysis but we shall see how things go. Check CXR. Valerie Cordova C

## 2016-02-14 NOTE — Progress Notes (Signed)
Pt repositioned in bed to sit upright.  Appeared to make more alert.  Able to assist/convince Pt to swallow one Pill - 200mg  Amiodarone documented.  One bite of cake, but nothing else off lunch tray.  "I'm sick." Unable to verbalize in what way.  Will con't plan of care as able.

## 2016-02-14 NOTE — Progress Notes (Signed)
ANTICOAGULATION CONSULT NOTE   Pharmacy Consult for heparin>>warfarin Indication: new onset atrial fibrillation with acute embolus to right lower extremity  Allergies  Allergen Reactions  . Codeine Itching and Nausea And Vomiting  . Vicodin [Hydrocodone-Acetaminophen] Itching and Nausea And Vomiting    Patient Measurements: Height: 5\' 2"  (157.5 cm) Weight: 232 lb 5.8 oz (105.4 kg) IBW/kg (Calculated) : 50.1 Heparin Dosing Weight: ~69 kg  Vital Signs: Temp: 98 F (36.7 C) (05/03 0530) Temp Source: Axillary (05/03 0530) BP: 122/65 mmHg (05/03 0530) Pulse Rate: 79 (05/03 0530)  Labs:  Recent Labs  02/11/16 1126  02/12/16 0320 02/12/16 0500 02/13/16 0558 02/14/16 0311  HGB  --   < > 10.0*  --  10.3* 10.1*  HCT  --   --  29.4*  --  31.0* 29.9*  PLT  --   --  111*  --  132* 150  LABPROT  --   --   --   --  15.9* 16.3*  INR  --   --   --   --  1.26 1.30  HEPARINUNFRC  --   < > 0.30  --  0.57 0.59  CREATININE  --   --   --  6.53* 6.51* 6.41*  CKTOTAL  --   --  3734*  --  1833*  --   TROPONINI 0.18*  --   --   --   --   --   < > = values in this interval not displayed.  Estimated Creatinine Clearance: 7.8 mL/min (by C-G formula based on Cr of 6.41).   Assessment: [age] yof with new onset atrial fibrillation with acute embolus to right lower extremity. Initially started on heparin on 4/25 that was stopped due R femoral embolectomy and 4 compartment fasciotomy.  Pharmacy has been managing heparin and warfarin for afib. Warfarin started 5/1  -Heparin level at goal (HL= 0.50)  -INR (1.3) trending up after 2 doses of warfarin  -CBC stable   Goal of Therapy:  INR goal 2-3 Heparin level 0.3-0.7 units/ml Monitor platelets by anticoagulation protocol: Yes   Plan:  -Continue heparin at 1600 units/hr -Continue Warfarin at low dose 4mg  tonight  Sheppard CoilFrank Wilson PharmD., BCPS Clinical Pharmacist Pager [phone] 02/14/2016 9:57 AM

## 2016-02-14 NOTE — Care Management Note (Signed)
Case Management Note Donn PieriniKristi Kit Brubacher RN, BSN Unit 2W-Case Manager [phone]  Patient Details  Name: Valerie NettlesBetty Cordova MRN: [id number] Date of Birth: 11-01-1931  Subjective/Objective:     Pt admitted with leg ischemia - s/p: right femoral embolectomy, vein patch angioplasty of the femoral artery and 4 compartment fasciotomy  - tx to 2W on 02/13/16               Action/Plan: [facility] EMS [facility]- pt was staying with daughter at some point- per PT - [facility] has been recommended- CSW following- d/c planning for SNF.   Expected Discharge Date:                  Expected Discharge Plan:  Skilled Nursing Facility  In-House Referral:  Clinical Social Work  Discharge planning Services  CM Consult  Post Acute Care Choice:    Choice offered to:     DME Arranged:    DME Agency:     HH Arranged:    HH Agency:     Status of Service:  In process, will continue to follow  Medicare Important Message Given:    Date Medicare IM Given:    Medicare IM give by:    Date Additional Medicare IM Given:    Additional Medicare Important Message give by:     If discussed at Long Length of Stay Meetings, dates discussed:    Additional Comments:  Darrold SpanWebster, Lois Ostrom Hall, RN 02/14/2016, 10:48 AM

## 2016-02-14 NOTE — Progress Notes (Signed)
       Patient Name: Valerie Cordova Date of Encounter: 02/14/2016    SUBJECTIVE: Patient is being attended by the nursing assistant with bathing. She has no cardiac complaints. Denies chest pain.  TELEMETRY:  Atrial fibrillation with controlled ventricular response: Filed Vitals:   02/13/16 1650 02/13/16 1939 02/14/16 0530 02/14/16 0600  BP: 104/61 131/45 122/65   Pulse: 73 65 79   Temp: 98.9 F (37.2 C) 98.9 F (37.2 C) 98 F (36.7 C)   TempSrc:  Oral Axillary   Resp: 17 18 18    Height:      Weight:    232 lb 5.8 oz (105.4 kg)  SpO2: 94% 91% 93%     Intake/Output Summary (Last 24 hours) at 02/14/16 0925 Last data filed at 02/14/16 0602  Gross per 24 hour  Intake    964 ml  Output   1250 ml  Net   -286 ml   LABS: Basic Metabolic Panel:  Recent Labs  40/98/1103/12/01 0558 02/13/16 1530 02/14/16 0311  NA 135  --  137  K 2.7*  --  3.6  CL 83*  --  85*  CO2 40*  --  39*  GLUCOSE 102*  --  119*  BUN 36*  --  32*  CREATININE 6.51*  --  6.41*  CALCIUM 7.8*  --  8.0*  MG  --  1.6*  --   PHOS 4.2  --  3.3   CBC:  Recent Labs  02/13/16 0558 02/14/16 0311  WBC 6.4 8.6  HGB 10.3* 10.1*  HCT 31.0* 29.9*  MCV 92.8 94.0  PLT 132* 150   Cardiac Enzymes:  Recent Labs  02/11/16 1126 02/12/16 0320 02/13/16 0558  CKTOTAL  --  3734* 1833*  TROPONINI 0.18*  --   --      Radiology/Studies:  No new data  Physical Exam: Blood pressure 122/65, pulse 79, temperature 98 F (36.7 C), temperature source Axillary, resp. rate 18, height 5\' 2"  (1.575 m), weight 232 lb 5.8 oz (105.4 kg), SpO2 93 %. Weight change: 10 lb 5.8 oz (4.7 kg)  Wt Readings from Last 3 Encounters:  02/14/16 232 lb 5.8 oz (105.4 kg)    Respiratory rate is stable and patient is in no distress Lungs are clear Irregularly irregular rhythm  ASSESSMENT:  1. Stable without rapid ventricular response to atrial fibrillation. Still maintaining in atrial fib. 2. Dementia 3. Acute on chronic kidney  failure  Plan:  1. Continue low-dose oral amiodarone. At follow-up in 4-6 weeks if still in atrial fibrillation, the amiodarone will likely be discontinued and other means of rate control considered. Should reduce the dose of amiodarone to 200 mg twice daily at discharge and ultimate dose in 3 weeks should be 200 mg daily. 2. Chronic anticoagulation therapy, lifelong. With kidney failure Coumadin is only choice. 3. Please call if we can be of further assistance.  Selinda EonSigned, Stone Spirito III,Braeton Wolgamott W 02/14/2016, 9:25 AM

## 2016-02-14 NOTE — Progress Notes (Signed)
Physical Therapy Treatment Patient Details Name: Valerie Cordova MRN: [id number] DOB: 11-Jun-1932 Today's Date: 02/14/2016    History of Present Illness 82 y.o. female with Alzheimers admitted for acute critical limb ischemia secondary to acute right LE arterial occlusion. Found to be in new onset atrial fibrillation w/ RVR. Pt now s/p right femoral embolectomy, vein patch angioplasty femoral artery, and 4 compartment fasciotomy. PMH includes UTI, gout, CHF, dysphagia, Alzheimers, depression, hypothyroid disease, GERD, gout.    PT Comments    Patient tolerated sitting EOB and standing x1 today however difficult to progress as pt abruptly laying down without warning despite cues for plan and mobility. Requires assist of 2 to stand- based on level of effort variable assist needed. Gait and transfers deferred today due to safety concerns and pt not able to sequence or follow commands. Appropriate for ST SNF. Will follow.    Follow Up Recommendations  SNF     Equipment Recommendations  None recommended by PT    Recommendations for Other Services       Precautions / Restrictions Precautions Precautions: Fall Precaution Comments: sits or lays down abruptly, wound vac on R LE Restrictions Weight Bearing Restrictions: No    Mobility  Bed Mobility Overal bed mobility: Needs Assistance Bed Mobility: Supine to Sit     Supine to sit: Mod assist;+2 for physical assistance;HOB elevated Sit to supine: Mod assist;+2 for physical assistance   General bed mobility comments: Performed supine to/from sit x5 due to abruptly laying down, always requiring assist to sit up.   Transfers Overall transfer level: Needs assistance Equipment used: Rolling walker (2 wheeled) Transfers: Sit to/from Stand Sit to Stand: Mod assist;+2 physical assistance         General transfer comment: Attempted to stand on multiple occasions, but pt resisting therapists. Stood x1 with assist of 2 but abruptly sat down  without warning.  Ambulation/Gait Ambulation/Gait assistance:  (Pt declined walking today- abruptly sitting down prior to trying to walk.)               Stairs            Wheelchair Mobility    Modified Rankin (Stroke Patients Only)       Balance Overall balance assessment: Needs assistance Sitting-balance support: Feet supported;No upper extremity supported Sitting balance-Leahy Scale: Fair Sitting balance - Comments: min guard for safety due to laying down without warning   Standing balance support: During functional activity;Bilateral upper extremity supported Standing balance-Leahy Scale: Poor Standing balance comment: tactile and verbal cues for standing with RW, sits without notice.                     Cognition Arousal/Alertness: Awake/alert Behavior During Therapy: WFL for tasks assessed/performed Overall Cognitive Status: No family/caregiver present to determine baseline cognitive functioning Area of Impairment: Safety/judgement;Attention;Orientation;Following commands;Problem solving Orientation Level: Disoriented to;Place;Time Current Attention Level: Sustained Memory: Decreased short-term memory Following Commands: Follows one step commands inconsistently;Follows one step commands with increased time (with multimodal cues) Safety/Judgement: Decreased awareness of safety;Decreased awareness of deficits   Problem Solving: Slow processing General Comments: Minimal verbalizations today. Would agree to something and then do the opposite.    Exercises      General Comments General comments (skin integrity, edema, etc.): VAC RLE      Pertinent Vitals/Pain Faces Pain Scale: No hurt    Home Living  Prior Function            PT Goals (current goals can now be found in the care plan section) Progress towards PT goals: Not progressing toward goals - comment (secondary to abruptly laying down or sitting)     Frequency  Min 2X/week    PT Plan Current plan remains appropriate    Co-evaluation             End of Session Equipment Utilized During Treatment: Gait belt Activity Tolerance: Patient limited by fatigue Patient left: in bed;with call bell/phone within reach;with bed alarm set     Time: 0943-1006 PT Time Calculation (min) (ACUTE ONLY): 23 min  Charges:  $Therapeutic Activity: 23-37 mins                    G Codes:      Afton Mikelson A Isidoro Santillana 02/14/2016, 10:40 AM Mylo RedShauna Nyonna Hargrove, PT, DPT [phone]

## 2016-02-15 ENCOUNTER — Inpatient Hospital Stay (HOSPITAL_COMMUNITY): Payer: Medicare (Managed Care)

## 2016-02-15 DIAGNOSIS — I4901 Ventricular fibrillation: Secondary | ICD-10-CM

## 2016-02-15 LAB — CBC
HCT: 26.9 % — ABNORMAL LOW (ref 36.0–46.0)
HEMATOCRIT: 29.7 % — AB (ref 36.0–46.0)
HEMOGLOBIN: 9.4 g/dL — AB (ref 12.0–15.0)
Hemoglobin: 8.7 g/dL — ABNORMAL LOW (ref 12.0–15.0)
MCH: 29.8 pg (ref 26.0–34.0)
MCH: 31 pg (ref 26.0–34.0)
MCHC: 31.6 g/dL (ref 30.0–36.0)
MCHC: 32.3 g/dL (ref 30.0–36.0)
MCV: 94.3 fL (ref 78.0–100.0)
MCV: 95.7 fL (ref 78.0–100.0)
PLATELETS: 172 10*3/uL (ref 150–400)
PLATELETS: 200 10*3/uL (ref 150–400)
RBC: 3.15 MIL/uL — AB (ref 3.87–5.11)
RBC: 2.81 MIL/uL — ABNORMAL LOW (ref 3.87–5.11)
RDW: 15.2 % (ref 11.5–15.5)
RDW: 15.1 % (ref 11.5–15.5)
WBC: 9 10*3/uL (ref 4.0–10.5)
WBC: 13.1 10*3/uL — AB (ref 4.0–10.5)

## 2016-02-15 LAB — RENAL FUNCTION PANEL
ANION GAP: 17 — AB (ref 5–15)
Albumin: 2.5 g/dL — ABNORMAL LOW (ref 3.5–5.0)
BUN: 34 mg/dL — ABNORMAL HIGH (ref 6–20)
CHLORIDE: 87 mmol/L — AB (ref 101–111)
CO2: 34 mmol/L — AB (ref 22–32)
CREATININE: 6.32 mg/dL — AB (ref 0.44–1.00)
Calcium: 8.1 mg/dL — ABNORMAL LOW (ref 8.9–10.3)
GFR, EST AFRICAN AMERICAN: 6 mL/min — AB (ref >60)
GFR, EST NON AFRICAN AMERICAN: 6 mL/min — AB (ref >60)
Glucose, Bld: 101 mg/dL — ABNORMAL HIGH (ref 65–99)
Phosphorus: 3 mg/dL (ref 2.5–4.6)
Potassium: 3.2 mmol/L — ABNORMAL LOW (ref 3.5–5.1)
Sodium: 138 mmol/L (ref 135–145)

## 2016-02-15 LAB — TROPONIN I: TROPONIN I: 0.05 ng/mL — AB (ref <0.031)

## 2016-02-15 LAB — BASIC METABOLIC PANEL
Anion gap: 20 — ABNORMAL HIGH (ref 5–15)
BUN: 34 mg/dL — AB (ref 6–20)
CALCIUM: 8.1 mg/dL — AB (ref 8.9–10.3)
CO2: 31 mmol/L (ref 22–32)
CREATININE: 6.46 mg/dL — AB (ref 0.44–1.00)
Chloride: 89 mmol/L — ABNORMAL LOW (ref 101–111)
GFR calc Af Amer: 6 mL/min — ABNORMAL LOW (ref >60)
GFR, EST NON AFRICAN AMERICAN: 5 mL/min — AB (ref >60)
GLUCOSE: 160 mg/dL — AB (ref 65–99)
POTASSIUM: 3.7 mmol/L (ref 3.5–5.1)
SODIUM: 140 mmol/L (ref 135–145)

## 2016-02-15 LAB — HEPARIN LEVEL (UNFRACTIONATED): HEPARIN UNFRACTIONATED: 0.54 [IU]/mL (ref 0.30–0.70)

## 2016-02-15 LAB — MAGNESIUM
MAGNESIUM: 2 mg/dL (ref 1.7–2.4)
Magnesium: 1.6 mg/dL — ABNORMAL LOW (ref 1.7–2.4)

## 2016-02-15 LAB — PROTIME-INR
INR: 1.44 (ref 0.00–1.49)
PROTHROMBIN TIME: 17.6 s — AB (ref 11.6–15.2)

## 2016-02-15 LAB — LACTIC ACID, PLASMA: Lactic Acid, Venous: 4.9 mmol/L (ref 0.5–2.0)

## 2016-02-15 LAB — CK: Total CK: 1033 U/L — ABNORMAL HIGH (ref 38–234)

## 2016-02-15 MED ORDER — WARFARIN SODIUM 3 MG PO TABS: 4.0000 mg | ORAL_TABLET | Freq: Once | ORAL | Status: DC

## 2016-02-15 MED ORDER — POTASSIUM CHLORIDE CRYS ER 20 MEQ PO TBCR
20.0000 meq | EXTENDED_RELEASE_TABLET | Freq: Once | ORAL | Status: AC
  Administered 2016-02-15: 20 meq via ORAL
  Filled 2016-02-15: qty 1

## 2016-02-15 MED ORDER — HEPARIN (PORCINE) IN NACL 100-0.45 UNIT/ML-% IJ SOLN: 1600.0000 [IU]/h | INTRAMUSCULAR | Status: DC

## 2016-02-15 MED ORDER — AMIODARONE HCL 200 MG PO TABS
200.0000 mg | ORAL_TABLET | Freq: Two times a day (BID) | ORAL | Status: DC
  Administered 2016-02-15: 200 mg via ORAL
  Filled 2016-02-15: qty 1

## 2016-02-15 MED ORDER — MORPHINE BOLUS VIA INFUSION
5.0000 mg | INTRAVENOUS | Status: DC | PRN
  Filled 2016-02-15: qty 20

## 2016-02-15 MED ORDER — MORPHINE SULFATE 25 MG/ML IV SOLN
10.0000 mg/h | INTRAVENOUS | Status: DC
  Administered 2016-02-15: 5 mg/h via INTRAVENOUS
  Filled 2016-02-15 (×2): qty 10

## 2016-02-15 MED ORDER — POTASSIUM CHLORIDE 10 MEQ/100ML IV SOLN
10.0000 meq | Freq: Once | INTRAVENOUS | Status: AC
  Administered 2016-02-15: 10 meq via INTRAVENOUS
  Filled 2016-02-15: qty 100

## 2016-02-15 MED ORDER — MORPHINE SULFATE 25 MG/ML IV SOLN
10.0000 mg/h | INTRAVENOUS | Status: DC
  Filled 2016-02-15: qty 10

## 2016-02-15 MED ORDER — MAGNESIUM SULFATE IN D5W 1-5 GM/100ML-% IV SOLN
1.0000 g | Freq: Once | INTRAVENOUS | Status: AC
  Administered 2016-02-15: 1 g via INTRAVENOUS
  Filled 2016-02-15: qty 100

## 2016-02-15 NOTE — Progress Notes (Signed)
Pt had a 8 beat run of vtach and potassium levels are 3.2. PA paged and made aware. Pt is responsive and vitals are stable. Will continue to monitor.

## 2016-02-15 NOTE — Progress Notes (Signed)
Pt was reponsive and had just received morning meds at 1440. Staff nurse went to check telemetry monitor and contact PA. Pt had frequent runs of vtach between 1516 and 1520. Pt went into vfib at 1525. Pt was unresponsive and not breathing no pulse. Code blue called and Primary nurse started compressions at 1525 staff nurses arrived pads placed shock delivered. Code team arrived. Md notified. Pt to be transferred to critical care unit.

## 2016-02-15 NOTE — Progress Notes (Signed)
I have had extensive discussions with family all children. We discussed patients current circumstances and organ failures. We also discussed patient's prior wishes under circumstances such as this. Family has decided to NOT perform resuscitation if arrest and comfort now.  Mcarthur Rossettianiel J. Tyson AliasFeinstein, MD, FACP Pgr: [phone] [facility]

## 2016-02-15 NOTE — Progress Notes (Signed)
Pt had 10 beats of Donavan BurnetVTach Kim, PA paged and aware. VSS, NSR on the monitor.  Pt resting in bed. Oncoming RN aware.   Roselie AwkwardMegan Leman Martinek, RN

## 2016-02-15 NOTE — Progress Notes (Signed)
Rt responded to code blue. Pt receiving CPR upon arrival. RT began to manually ventilate pt x21min. Pt with ROSC. Placed on NRB. RT will continue to monitor.

## 2016-02-15 NOTE — Progress Notes (Signed)
Subjective:    Reports she feels " ok today", no acute concerns  Objective Vital signs in last 24 hours: Filed Vitals:   02/14/16 0600 02/14/16 1422 02/14/16 1934 02/15/16 0509  BP:  135/78 122/79 148/63  Pulse:  61 79 86  Temp:  98.3 F (36.8 C) 98 F (36.7 C) 97.7 F (36.5 C)  TempSrc:  Oral Oral Oral  Resp:  17 18 18   Height:      Weight: 105.4 kg (232 lb 5.8 oz)   102.9 kg (226 lb 13.7 oz)  SpO2:  92% 93% 93%   Weight change: -2.5 kg (-5 lb 8.2 oz)  Intake/Output Summary (Last 24 hours) at 02/15/16 0913 Last data filed at 02/15/16 0756  Gross per 24 hour  Intake      0 ml  Output   3725 ml  Net  -3725 ml   UOP over last 12 hours- 1.4 ml/kg/hr  Labs: Basic Metabolic Panel:  Recent Labs Lab 02/13/16 0558 02/14/16 0311 02/15/16 0319  NA 135 137 138  K 2.7* 3.6 3.2*  CL 83* 85* 87*  CO2 40* 39* 34*  GLUCOSE 102* 119* 101*  BUN 36* 32* 34*  CREATININE 6.51* 6.41* 6.32*  CALCIUM 7.8* 8.0* 8.1*  PHOS 4.2 3.3 3.0   Liver Function Tests:  Recent Labs Lab 02/09/16 1206  02/13/16 0558 02/14/16 0311 02/15/16 0319  AST 340*  --   --   --   --   ALT 97*  --   --   --   --   ALKPHOS 40  --   --   --   --   BILITOT 1.4*  --   --   --   --   PROT 4.6*  --   --   --   --   ALBUMIN 2.5*  < > 2.2* 2.4* 2.5*  < > = values in this interval not displayed. No results for input(s): LIPASE, AMYLASE in the last 168 hours. No results for input(s): AMMONIA in the last 168 hours. CBC:  Recent Labs Lab 02/11/16 0605 02/12/16 0320 02/13/16 0558 02/14/16 0311 02/15/16 0319  WBC 6.2 5.9 6.4 8.6 9.0  HGB 10.6* 10.0* 10.3* 10.1* 9.4*  HCT 30.4* 29.4* 31.0* 29.9* 29.7*  MCV 89.9 90.5 92.8 94.0 94.3  PLT 100* 111* 132* 150 172   Cardiac Enzymes:  Recent Labs Lab 02/10/16 0308 02/10/16 1130 02/10/16 2128 02/11/16 0605 02/11/16 1126 02/12/16 0320 02/13/16 0558 02/14/16 0311 02/15/16 0319  CKTOTAL  --  15006*  --  8137*  --  3734* 1833* 1435* 1033*  TROPONINI  0.30* 0.28* 0.23* 0.19* 0.18*  --   --   --   --    CBG: No results for input(s): GLUCAP in the last 168 hours.  Iron Studies: No results for input(s): IRON, TIBC, TRANSFERRIN, FERRITIN in the last 72 hours. Studies/Results: Dg Chest Port 1 View  02/14/2016  CLINICAL DATA:  CHF.  Shortness of breath. EXAM: PORTABLE CHEST 1 VIEW COMPARISON:  02/11/2016 chest radiograph. FINDINGS: Right internal jugular central venous catheter terminates in the upper third of the superior vena cava. Stable cardiomediastinal silhouette with mild cardiomegaly. No pneumothorax. Stable trace bilateral pleural effusions. Mild pulmonary edema appears slightly worsened. Vertebroplasty material is again noted in the mid thoracic spine. IMPRESSION: 1. Mild congestive heart failure, slightly worsened. 2. Probable stable trace bilateral pleural effusions. Electronically Signed   By: Delbert PhenixJason A Poff M.D.   On: 02/14/2016 15:16   Medications: Infusions: .  Gerhardt's butt cream    . heparin 1,600 Units/hr (02/14/16 0458)    Scheduled Medications: . amiodarone  200 mg Oral BID  . cyanocobalamin  2,000 mcg Intramuscular Q30 days  . docusate sodium  100 mg Oral Daily  . donepezil  10 mg Oral QHS  . feeding supplement (ENSURE ENLIVE)  237 mL Oral TID BM  . ferrous sulfate  325 mg Oral Q breakfast  . furosemide  160 mg Intravenous BID  . levothyroxine  75 mcg Oral QAC breakfast  . lidocaine  10 mL Intradermal Once  . pantoprazole  40 mg Oral Daily  . potassium chloride  20 mEq Oral Once  . warfarin  4 mg Oral ONCE-1800  . Warfarin - Pharmacist Dosing Inpatient   Does not apply q1800    have reviewed scheduled and prn medications.  Physical Exam: General: awake, alert, NAD, pleasant Heart: RRR, no murmurs Lungs: CTAB, on Room air Abdomen: obese, soft, NT/ND, +BS Extremities: RLE with +1 edema to ankle (calf wrapped with wound vac in place), LLE with +1 edema Neuro: AOx1 (self only), cooperative Dialysis Access:  R IJ     Assessment/ Plan: Valerie Cordova is a 82 y.o. Female admitted on 4/25 for RLE ischemia in setting of  new onset Afib with RVR. PMH sig for dementia. She is s/p R femoral embolectomy, vein patch angioplasty and 4 compartment fasciotomy. Baseline renal function unknown.   1.Renal- AKI in the setting of hypotension/ rhabdomyolysis/ decreased renal perfusion. Likely has renal artery stenosis, given atrophic right kidney on renal u/s 4/26. UOP 3375 cc over last 24. Daily RFP.   Cr 6.32. K 3.2. Phos 3  FENa 5.66%. Renal artery doppler inconclusive. Wt 222 lb from 218 lb on admission. Will d/c NS. Lasix diuresis with more than adequate response 2. Hypertension/volume - hypotension resolved  3. New onset Afib- NSR now. On Heparin gtt. Management per Cards 4. Anemia - Hgb 10.0 5. Dementia - Aricept per primary 6. Acute combined systolic/diastolic CHF 7. Hypothyroidism   Improving UOP, Cr, CK. Continue to hold HD for now. Maintain HD cath for now.    Alyssa A. Kennon Rounds MD, MS Family Medicine Resident PGY-2 Pager [phone]  Renal Attending: Diuresing for volume excess.  Will continue.  Renal fct slowly prob improved. Altered mentation from morphine. No dialysis at this time.  Himani Corona C

## 2016-02-15 NOTE — Progress Notes (Signed)
Received report from off going nurse that patient is now on comfort care. Went in to check on patient and family. Increased morphine dose from 5mg  to 10mg . Will continue to monitor closely.

## 2016-02-15 NOTE — Code Documentation (Signed)
  Patient Name: Valerie NettlesBetty Cordova   MRN: [id number]   Date of Birth/ Sex: [birth year] , female      Admission Date: 01/24/2016  Attending Provider: Chuck Hinthristopher S Dickson, MD  Primary Diagnosis: Critical lower limb ischemia   Indication: Pt was in her usual state of health until this AM however did have some short runs of VT earlier in the day, when she noted to be blue per the nurse and was then noted to be in V-fib on the monitor and pulseless. Code blue was subsequently called. At the time of arrival on scene, ACLS protocol was underway. Patient received 2 minutes of CPR with ROSC.    Technical Description:  - CPR performance duration:  2 minutes  - Was defibrillation or cardioversion used? No   - Was external pacer placed? No  - Was patient intubated pre/post CPR? No   Medications Administered: Y = Yes; Blank = No Amiodarone    Atropine    Calcium    Epinephrine    Lidocaine    Magnesium    Norepinephrine    Phenylephrine    Sodium bicarbonate    Vasopressin     Post CPR evaluation:  - Final Status - Was patient successfully resuscitated ? Yes - What is current rhythm? Atrial fibrillation  - What is current hemodynamic status? Pulses  Miscellaneous Information:  - Labs sent, including: Code Blue labs   - Primary team notified?   Yes  - Family Notified? Yes  - Additional notes/ transfer status:  ICU, provide 10 meq of K     Tayari Yankee Mayra ReelZahra Jerad Dunlap, MD  02/15/2016, 3:39 PM

## 2016-02-15 NOTE — Progress Notes (Signed)
Called urgently to see this patient who had a cardiac arrest this afternoon. The patient was initially seen by our service 07-07-2016 for preoperative evaluation when she presented with acute critical limb ischemia and was found to have right lower extremity arterial occlusion. She had atrial fibrillation with RVR at the time of her presentation. She underwent embolectomy and has done well from a vascular perspective. She has been treated with heparin and warfarin. Notes indicate significant dementia. The patient has been treated with amiodarone for heart rhythm control. An echocardiogram from April 26 is reviewed and shows an ejection fraction of 40-45% with diffuse hypokinesis. There are no severe valvular lesions noted.  This afternoon the patient developed runs of ventricular tachycardia. Initially this was nonsustained, and then she had ventricular fibrillation requiring emergency defibrillation. She has now been promptly resuscitated.  On my evaluation, the patient is awake and able to follow a few commands. She is not able to provide any history. Vital signs show a heart rate of 109 bpm and blood pressure of 122/50. JVP appears normal. Lungs are rhonchorous. Heart is irregularly irregular with a systolic ejection murmur and distant heart sounds. Extremity exam with a well-perfused right foot. The patient moves all extremities.  Labs from this morning show hypokalemia and hypomagnesemia with potassium and magnesium levels of 3.2 and 1.6, respectively. On her monitor, the QT interval looks prolonged. She is currently in atrial fibrillation.  Discussed with Dr. Durwin Noraixon and members of the code team. It is possible that VT/VF have occurred in the setting of prolonged QT with associated electrolyte disturbances and presence of amiodarone. Will hold amiodarone, replete potassium and magnesium, and follow her closely. CODE STATUS has been discussed with her family and they're on the way to the hospital now. CCM  will evaluate her and the patient will be transferred to the intensive care unit.  The patient is critically ill with multiple organ systems failure and requires high complexity decision making for assessment and support, frequent evaluation and titration of therapies, application of advanced monitoring technologies and extensive interpretation of multiple databases.   Critical Care Time devoted to patient care services described in this note is 40 minutes.

## 2016-02-15 NOTE — Progress Notes (Addendum)
  Progress Note  VASCULAR SURGERY ASSESSMENT AND PLAN:   Status post right femoral embolectomy and vein patch angioplasty with 4 compartment fasciotomy. Given the swelling [facility] If she is discharged to a skilled nursing facility I can follow this wound as an outpatient.   PAF: On heparin and Coumadin per pharmacy. (INR = 1.4) Cardiology is following.   RENAL: Renal function is stable. Nephrology is following.   Cari Carawayhris Jacynda Brunke, MD [phone]   02/15/2016 10:28 AM 9 Days Post-Op  Subjective:  Sleepy this morning.  Daughter concerned.  Afebrile HR  70's-80's NSR 120's-140's systolic 93% RA  Filed Vitals:   02/14/16 1934 02/15/16 0509  BP: 122/79 148/63  Pulse: 79 86  Temp: 98 F (36.7 C) 97.7 F (36.5 C)  Resp: 18 18    Physical Exam: Lungs:  Non labored Incisions:  Medial RLE incision is clean and dry with staples in tact; lateral incision with vac with good seal Extremities:  +swelling RLE   CBC    Component Value Date/Time   WBC 9.0 02/15/2016 0319   RBC 3.15* 02/15/2016 0319   HGB 9.4* 02/15/2016 0319   HCT 29.7* 02/15/2016 0319   PLT 172 02/15/2016 0319   MCV 94.3 02/15/2016 0319   MCH 29.8 02/15/2016 0319   MCHC 31.6 02/15/2016 0319   RDW 15.2 02/15/2016 0319    BMET    Component Value Date/Time   NA 138 02/15/2016 0319   K 3.2* 02/15/2016 0319   CL 87* 02/15/2016 0319   CO2 34* 02/15/2016 0319   GLUCOSE 101* 02/15/2016 0319   BUN 34* 02/15/2016 0319   CREATININE 6.32* 02/15/2016 0319   CALCIUM 8.1* 02/15/2016 0319   GFRNONAA 6* 02/15/2016 0319   GFRAA 6* 02/15/2016 0319    INR    Component Value Date/Time   INR 1.44 02/15/2016 0319     Intake/Output Summary (Last 24 hours) at 02/15/16 1028 Last data filed at 02/15/16 0756  Gross per 24 hour  Intake      0 ml  Output   3725 ml  Net  -3725 ml     Assessment:  82 y.o. female is s/p:  1. Right femoral embolectomy 2. Vein patch angioplasty  right common femoral artery 3. Four compartment fasciotomy 4. Placement of negative pressure dressing on lateral fasciotomy site  9 Days Post-Op  Plan: -pt sleepy this morning-received IV morphine this morning.  Daughter is concerned.  Will hold on the IV morphine and use Tylenol.  If mental status doesn't improve, may need CT scan, but suspect it is morphine related. -per cardiology: 1. Continue low-dose oral amiodarone. At follow-up in 4-6 weeks if still in atrial fibrillation, the amiodarone will likely be discontinued and other means of rate control considered. Should reduce the dose of amiodarone to 200 mg twice daily at discharge and ultimate dose in 3 weeks should be 200 mg daily. 2. Chronic anticoagulation therapy, lifelong. With kidney failure Coumadin is only choice. 3. Please call if we can be of further assistance.  -DVT prophylaxis:  Heparin gtt-INR slowly increasing-continue coumadin per pharmacy. -creatinine slightly improved from yesterday-renal following   [name], PA-C [facility] [phone] 02/15/2016 10:28 AM   I have interviewed the patient and examined the patient. I agree with the findings by the PA.

## 2016-02-15 NOTE — Care Management Note (Addendum)
Case Management Note Donn PieriniKristi Teyonna Plaisted RN, BSN Unit 2W-Case Manager [phone]  Patient Details  Name: Valerie NettlesBetty Cordova MRN: [id number] Date of Birth: [birth year]  Subjective/Objective:     Pt admitted with leg ischemia - s/p: right femoral embolectomy, vein patch angioplasty of the femoral artery and 4 compartment fasciotomy  - tx to 2W on 02/13/16               Action/Plan: [facility] EMS [facility]- pt was staying with daughter at some point- per PT - STSNF has been recommended- CSW following- d/c planning for SNF.   Expected Discharge Date:                  Expected Discharge Plan:  Skilled Nursing Facility  In-House Referral:  Clinical Social Work  Discharge planning Services  CM Consult  Post Acute Care Choice:    Choice offered to:     DME Arranged:    DME Agency:     HH Arranged:    HH Agency:     Status of Service:  In process, will continue to follow  Medicare Important Message Given:    Date Medicare IM Given:    Medicare IM give by:    Date Additional Medicare IM Given:    Additional Medicare Important Message give by:     If discussed at Long Length of Stay Meetings, dates discussed:  02/15/16  Additional Comments:  02/15/16- 1600- Pt had Code Blue called this afternoon-  pt to be tx to ICU post code- CM to continue to follow.   Darrold SpanWebster, Oniel Meleski Hall, RN 02/15/2016, 4:09 PM

## 2016-02-15 NOTE — Progress Notes (Signed)
ANTICOAGULATION CONSULT NOTE   Pharmacy Consult for heparin>>warfarin Indication: new onset atrial fibrillation with acute embolus to right lower extremity  Allergies  Allergen Reactions  . Codeine Itching and Nausea And Vomiting  . Vicodin [Hydrocodone-Acetaminophen] Itching and Nausea And Vomiting    Patient Measurements: Height: 5\' 2"  (157.5 cm) Weight: 226 lb 13.7 oz (102.9 kg) IBW/kg (Calculated) : 50.1 Heparin Dosing Weight: ~69 kg  Vital Signs: Temp: 97.7 F (36.5 C) (05/04 0509) Temp Source: Oral (05/04 0509) BP: 148/63 mmHg (05/04 0509) Pulse Rate: 86 (05/04 0509)  Labs:  Recent Labs  02/13/16 0558 02/14/16 0311 02/15/16 0319  HGB 10.3* 10.1* 9.4*  HCT 31.0* 29.9* 29.7*  PLT 132* 150 172  LABPROT 15.9* 16.3* 17.6*  INR 1.26 1.30 1.44  HEPARINUNFRC 0.57 0.59 0.54  CREATININE 6.51* 6.41* 6.32*  CKTOTAL 1833* 1435* 1033*    Estimated Creatinine Clearance: 7.8 mL/min (by C-G formula based on Cr of 6.32).   Assessment: [age] yof with new onset atrial fibrillation with acute embolus to right lower extremity. Initially started on heparin on 4/25 that was stopped due R femoral embolectomy and 4 compartment fasciotomy.  Pharmacy has been managing heparin and warfarin for afib. Warfarin started 5/1  No warfarin charted last night 5/3, per RN reports this am patient was refusing most medications or spitting them out. I assume this was one of them as they mentioned they were only able to pass amiodarone all day. Although it's likely she did not receive a dose her INR continues to trend up.  -Heparin level at goal (HL= 0.54)  -INR (1.4) trending up after 2 doses of warfarin  -CBC stable   Goal of Therapy:  INR goal 2-3 Heparin level 0.3-0.7 units/ml Monitor platelets by anticoagulation protocol: Yes   Plan:  -Continue heparin at 1600 units/hr -Try Warfarin 4mg  again tonight  Sheppard CoilFrank Elfreida Heggs PharmD., BCPS Clinical Pharmacist Pager [phone] 02/15/2016 10:06  AM

## 2016-02-15 NOTE — Progress Notes (Signed)
PULMONARY / CRITICAL CARE MEDICINE   Name: Valerie Cordova MRN: [id number] DOB: 05-21-1932    ADMISSION DATE:  02/12/16 CONSULTATION DATE:  4/26>>4/30, 5/1>>  REFERRING MD:  TRH   CHIEF COMPLAINT:  Post arrest   Brief Hx:  82 yo female with hx CHF, Alzhiemer's dementia who was initially admitted 4/25 for RLE ischemia and new onset AFib, RVR.  She underwent emergent R femoral artery embolectomy on 4/25.  Course has been complicated by progressive renal failure, UTI, complicated wound healing requiring VAC.  PCCM consulted originally for mild hypotension and AKI.  She improved and PCCM s/o.  On 5/4 she was on tele floor and had brief VT arrest, shocked 1x and tx to ICU.  PCCM asked to re-consult post arrest given hypoxia and worsening renal failure.   SUBJECTIVE:    VITAL SIGNS: BP 131/74 mmHg  Pulse 85  Temp(Src) 98.7 F (37.1 C) (Axillary)  Resp 16  Ht  (1.575 m)  Wt 102.9 kg (226 lb 13.7 oz)  BMI 41.48 kg/m2  SpO2 94%  HEMODYNAMICS:    VENTILATOR SETTINGS:    INTAKE / OUTPUT: I/O last 3 completed shifts: In: 0  Out: 4175 [Urine:4125; Drains:50]  PHYSICAL EXAMINATION: General:  Chronically ill appearing female, acutely ill, mild distress  Neuro:  Somnolent, opens eyes to voice, gen weakness, does not follow commands  HEENT:  Mm moist, NRB  Cardiovascular:  s1s2 irreg  Lungs:  resps even non labored on NRB, diminished throughout  Abdomen:  Round, soft, nt  Musculoskeletal:  Warm and dry, 1+ BLE edema, RLE incision c/d, swollen, VAC in place with bloody drainage   LABS:  BMET  Recent Labs Lab 02/14/16 0311 02/15/16 0319 02/15/16 1545  NA 137 138 140  K 3.6 3.2* 3.7  CL 85* 87* 89*  CO2 39* 34* 31  BUN 32* 34* 34*  CREATININE 6.41* 6.32* 6.46*  GLUCOSE 119* 101* 160*    Electrolytes  Recent Labs Lab 02/13/16 0558 02/13/16 1530 02/14/16 0311 02/15/16 0319 02/15/16 1545  CALCIUM 7.8*  --  8.0* 8.1* 8.1*  MG  --  1.6*  --  1.6* 2.0  PHOS 4.2   --  3.3 3.0  --     CBC  Recent Labs Lab 02/14/16 0311 02/15/16 0319 02/15/16 1545  WBC 8.6 9.0 13.1*  HGB 10.1* 9.4* 8.7*  HCT 29.9* 29.7* 26.9*  PLT 150 172 200    Coag's  Recent Labs Lab 02/09/16 1705 02/13/16 0558 02/14/16 0311 02/15/16 0319  APTT 58*  --   --   --   INR 1.27 1.26 1.30 1.44    Sepsis Markers  Recent Labs Lab 02/15/16 1545  LATICACIDVEN 4.9*    ABG No results for input(s): PHART, PCO2ART, PO2ART in the last 168 hours.  Liver Enzymes  Recent Labs Lab 02/09/16 1206  02/13/16 0558 02/14/16 0311 02/15/16 0319  AST 340*  --   --   --   --   ALT 97*  --   --   --   --   ALKPHOS 40  --   --   --   --   BILITOT 1.4*  --   --   --   --   ALBUMIN 2.5*  < > 2.2* 2.4* 2.5*  < > = values in this interval not displayed.  Cardiac Enzymes  Recent Labs Lab 02/11/16 0605 02/11/16 1126 02/15/16 1545  TROPONINI 0.19* 0.18* 0.05*    Glucose No results for input(s): GLUCAP  in the last 168 hours.  Imaging Dg Chest Port 1 View  02/15/2016  CLINICAL DATA:  Cardiac arrest. EXAM: PORTABLE CHEST 1 VIEW COMPARISON:  02/14/2016 FINDINGS: There is vascular congestion, small bilateral pleural effusions and cardiomegaly. Vascular congestion has improved from the prior exam. There is no convincing residual pulmonary edema. Right internal jugular central venous catheter is stable with its tip in the mid superior vena cava. No pneumothorax on this semi-erect study. IMPRESSION: 1. Improved lung aeration since prior study. No convincing residual pulmonary edema. There is still some vascular congestion with small bilateral effusions and cardiomegaly. Electronically Signed   By: Amie Portlandavid  Ormond M.D.   On: 02/15/2016 17:02     STUDIES:  4/25 CXR > Cardiomegaly with pulmonary vascular congestion and likely mild interstitial edema 4/26 Renal ultrasound >  4/26 CT abdomen w/o contrast >   CULTURES: 4/26 Blood cx >neg  4/25 Urine cx > 6,000 colonies/mL  insignificant growth   ANTIBIOTICS: Vancomycin 4/26 > Zosyn 4/26 > TMP-SMX 4/25 > 4/25  SIGNIFICANT EVENTS: 4/25 Admit with critical limb ischemia, taken to OR with Dr. Edilia Boickson. Also found to have atrial fibrillation with RVR so cardiology was consulted 4/26 [facility] consulted. Worsening renal function and some hypotension prompting PCCM consult   DISCUSSION: 82yo female with significant vascular abnormalities admitted with RLE ischemia s/p embolectomy and fasciotomy and Afib RVR.  Course c/b AKI, VT.  Had brief VT arrest 5/4 and tx back to ICU   ASSESSMENT / PLAN:  PULMONARY Acute respiratory failure -- ?aspiration - dysphagia and oversedation per RN  P:   Supplemental O2 - cont NRB  F/u CXR  PRN BD  Tenuous respiratory status -- may need intubation if family wants to continue full code   CARDIOVASCULAR VT arrest 5/4 HTN  AFib  Chronic Combined heart failure  RLE ischemia  P:  Heparin gtt  Cardiology to see  VVS following  Trend lactate  F/u electrolytes  Hold amio  RENAL AKI  Hypokalemia  P:   F/u chem, CK, Mg, Phos  Renal following   GASTROINTESTINAL No active issue  P:   NPO for now   HEMATOLOGIC Coagulopathy - on coumadin for AFib  RLE ischemia - s/p embolectomy  P:  Heparin gtt  F/u cbc  F/u INR   INFECTIOUS No active issue  P:   Trend WBC and fever curve off abx  Watch RLE closely   ENDOCRINE Hypothyroid  P:   Cont synthroid  TSH ok   NEUROLOGIC AMS - lethargy -- multifactorial post brief arrest with ongoing hypotension +/- sedating medications  Hx dementia  P:   Hold sedating meds    FAMILY  - Updates:   - Inter-disciplinary family meet or Palliative Care meeting due by:      Dirk DressKaty Whiteheart, NP 02/15/2016  5:14 PM Pager: [phone] or [phone]) [phone]  STAFF NOTE: IRory Percy, Leonardo Plaia, MD FACP have personally reviewed patient's available data, including medical history, events of note, physical examination and test  results as part of my evaluation. I have discussed with resident/NP and other care providers such as pharmacist, RN and RRT. In addition, I personally evaluated patient and elicited key findings of: evaluated upon arrival from floor, post arrest with VF received shock, acls, awakened did not require intubation but on 100% NRB and lethargic and moderate distress resp status, i received personal information from Dr Edilia Boickson and d/w Dr Excell Seltzerooper, [facility], concern was QTC prolongation, certainly also contribution likely from multiple organ  failure , ARF, crt over 6 with age 26, EF known 40%, last pcxr with pulm edema, but did respond to diuretics with neg balance, clinical impression would agree with Cardiology, again in setting of CHF and renal failure, no evidence of active bleeding, wound rt lower ext with vac, no discharge or erythema, has some bruising, , ordered STAT abg, pcxr ordered, I called daughter no answer, they eventuallyt called back, impression was that she required intubation if we are to consider continued aggressive care, d/w pharmacy to continued heparin given INR low on coumadin and requirement for lower ext more than fib Overall impression is MODS, dementia, failure to progress, ARF, and a long hospital course resulting in arrest on floors indicating a horrble prognosis, she ouwl be a horrible HD candidate as well, had extensive d/w family all children and opted for full comfort care given their impression that she is not benefiting from this aggressive care and would siffer further. They voiced appreciation for Dr Quillian Quince efforts to help her the last 2 weeks, will add morphine drip now, then when comfortable will reduce to 6 liters The patient is critically ill with multiple organ systems failure and requires high complexity decision making for assessment and support, frequent evaluation and titration of therapies, application of advanced monitoring technologies and extensive interpretation of  multiple databases.   Critical Care Time devoted to patient care services described in this note is60 Minutes. This time reflects time of care of this signee: Rory Percy, MD FACP. This critical care time does not reflect procedure time, or teaching time or supervisory time of PA/NP/Med student/Med Resident etc but could involve care discussion time. Rest per NP/medical resident whose note is outlined above and that I agree with   Mcarthur Rossetti. Tyson Alias, MD, FACP Pgr: [phone] Uncertain Pulmonary & Critical Care 02/15/2016 8:44 PM

## 2016-02-15 NOTE — Progress Notes (Signed)
Received report from off going shift nurse. Pt comfort care with multiple family in the room. Pt comfortable on morphine drip at 10 mg. Will continue to monitor.

## 2016-02-15 NOTE — Progress Notes (Signed)
VASCULAR SURGERY:  ADDENDUM: This patient went into ventricular fibrillation and a cardiac arrest was called. She received 1 cardioversion and was successfully resuscitated. I appreciate the assistance of the code team and Dr. Excell Seltzerooper. The patient is being transferred to the intensive care unit and I have spoken to Dr. Tyson AliasFeinstein who will coordinate her care. I have updated the daughter and she is on her way to the hospital. I have discussed the decisions that the family will have to make in terms of how aggressive to be and whether or not she should be intubated if this were indicated.   From a vascular standpoint she has done well with a warm well-perfused right foot and viable muscle in the right leg. My plan has been to continue her IV heparin until her Coumadin is therapeutic and [facility] to manage her open lateral fasciotomy site.  Cardiology has been following her cardiac issues (new onset atrial fibrillation, arrhythmias, and Acute systolic congestive heart failure.)  Renal has been following her renal insufficiency which she had on admission.  Waverly Ferrarihristopher Dickson, MD, FACS Beeper [phone] Office: [phone]

## 2016-02-15 NOTE — Progress Notes (Signed)
RT attempted to obtain ABG from patient radial and brachial without success. MD is having discussion with family concerning patients code status. RT waiting until after meeting before getting another RT to attempt an ABG. RT will continue to monitor.

## 2016-02-19 ENCOUNTER — Telehealth: Payer: Self-pay

## 2016-02-19 NOTE — Telephone Encounter (Signed)
On 02/19/2016 I received a death certificate from [employer] [employer] (orginal). The death certificate is for burial. The patient is a patient of Doctor Tyson AliasFeinstein. [facility] am for [facility]. On 02/20/2016 I got the death certificate back from Doctor Tyson AliasFeinstein. I got the death certificate ready and called the funeral home to let them know I [facility].

## 2016-03-14 NOTE — Discharge Summary (Signed)
NAMEdson Snowball:  Mcconaghy, Ida                  ACCOUNT NO.:  [id number]  MEDICAL RECORD NO.:  [id number]  LOCATION:  2S09C                        FACILITY:  [facility]  PHYSICIAN:  Nelda Bucksaniel J Wrigley Plasencia, MD DATE OF BIRTH:  08/13/1932  DATE OF ADMISSION:  04/01/2016 DATE OF DISCHARGE:  03/04/2016                              DISCHARGE SUMMARY   DEATH SUMMARY  This is an 82 year old female with history of heart failure and Alzheimer's dementia, who presented on April 25 with right lower extremity ischemia and new onset of atrial fibrillation with RVR.  She underwent a right femoral artery embolectomy on April 25.  Course complicated with progressive renal failure, urinary tract infection, and complicated wound healing.  She required a fasciotomy secondary to compartment syndrome.  In the setting of continued medical failures including hypotension and acute kidney injury, she suffered a VT arrest, but did not require intubation during that time, did require shock. Post arrest, was transferred with some hypoxia, worsening, on 100% non- rebreather, worsening renal failure and neurologic status down to the intensive care unit.  I had extensive discussions with Dr. Waverly Ferrarihristopher Dickson.  He was at the bedside with the patient as well as Cardiology. It was thought that the amiodarone potentially in the setting of acute renal failure and other medical stressors and metabolic derangements contributed to QTc prolongation and VT arrest, but this was in the setting of failure to thrive and hospital-progressive multiorgan failure.  ABGs were obtained.  Chest x-rays were obtained.  Patient was supported on 100% non-rebreather, was lethargic and had distress.  I had discussions with the family, all children, who noted that she had been failing to thrive overall and now prolonged hospitalization with organ failures in an 82 years old now resulting in cardiac arrest in the floor giving her a poor prognosis.  They  described that she would not want to be continued on these circumstances.  Comfort care was provided.  Then the patient expired.  FINAL DIAGNOSES: 1. Upon death was VT arrest secondary to QTc prolongation in the     setting of metabolic stressors and amiodarone. 2. Ruled out ischemia. 3. Acute respiratory failure with hypoxemia most likely secondary to     atelectasis and pulmonary edema. 4. Peripheral vascular disease with right lower extremity ischemia,     status post embolectomy and fasciotomy secondary to compartment     syndrome. 5. Acute renal failure, acute tubular necrosis likely in nature. 6. Congestive heart failure. 7. Dementia.     Nelda Bucksaniel J  Jon, MD    DJF/MEDQ  D:  02/22/2016  T:  02/23/2016  Job:  [id number]

## 2016-03-14 NOTE — Progress Notes (Signed)
   03/02/16 0606  Clinical Encounter Type  Visited With Patient and family together;Health care provider  Visit Type Initial;Death  Referral From Nurse  Spiritual Encounters  Spiritual Needs Emotional;Grief support  Stress Factors  Family Stress Factors Loss   Chaplain responded to a death on 2S. Chaplain met with patient's daughter, son, daughter-in-law, and family friend. Chaplain offered support through empathic listening and facilitating a life review. Chaplain support available as needed.   Jeri Lager, Chaplain 2016/03/02 6:07 AM

## 2016-03-14 NOTE — Progress Notes (Signed)
Pt expired 0534 with family at bedside. Confirmed by this RN and Alyce PaganAllison Haggard, RN. PCCM notified. Chaplain paged and arrived at bedside. CDS notified, pt not a candidate for donation. Referral # [id number].

## 2016-03-14 DEATH — deceased

## 2017-03-07 IMAGING — CR DG CHEST 1V PORT
1 series · 1 of 1 positions shown · non-contrast
Comparison: 02/14/2016

CLINICAL DATA: Cardiac arrest.

EXAM:
PORTABLE CHEST 1 VIEW

[AP]
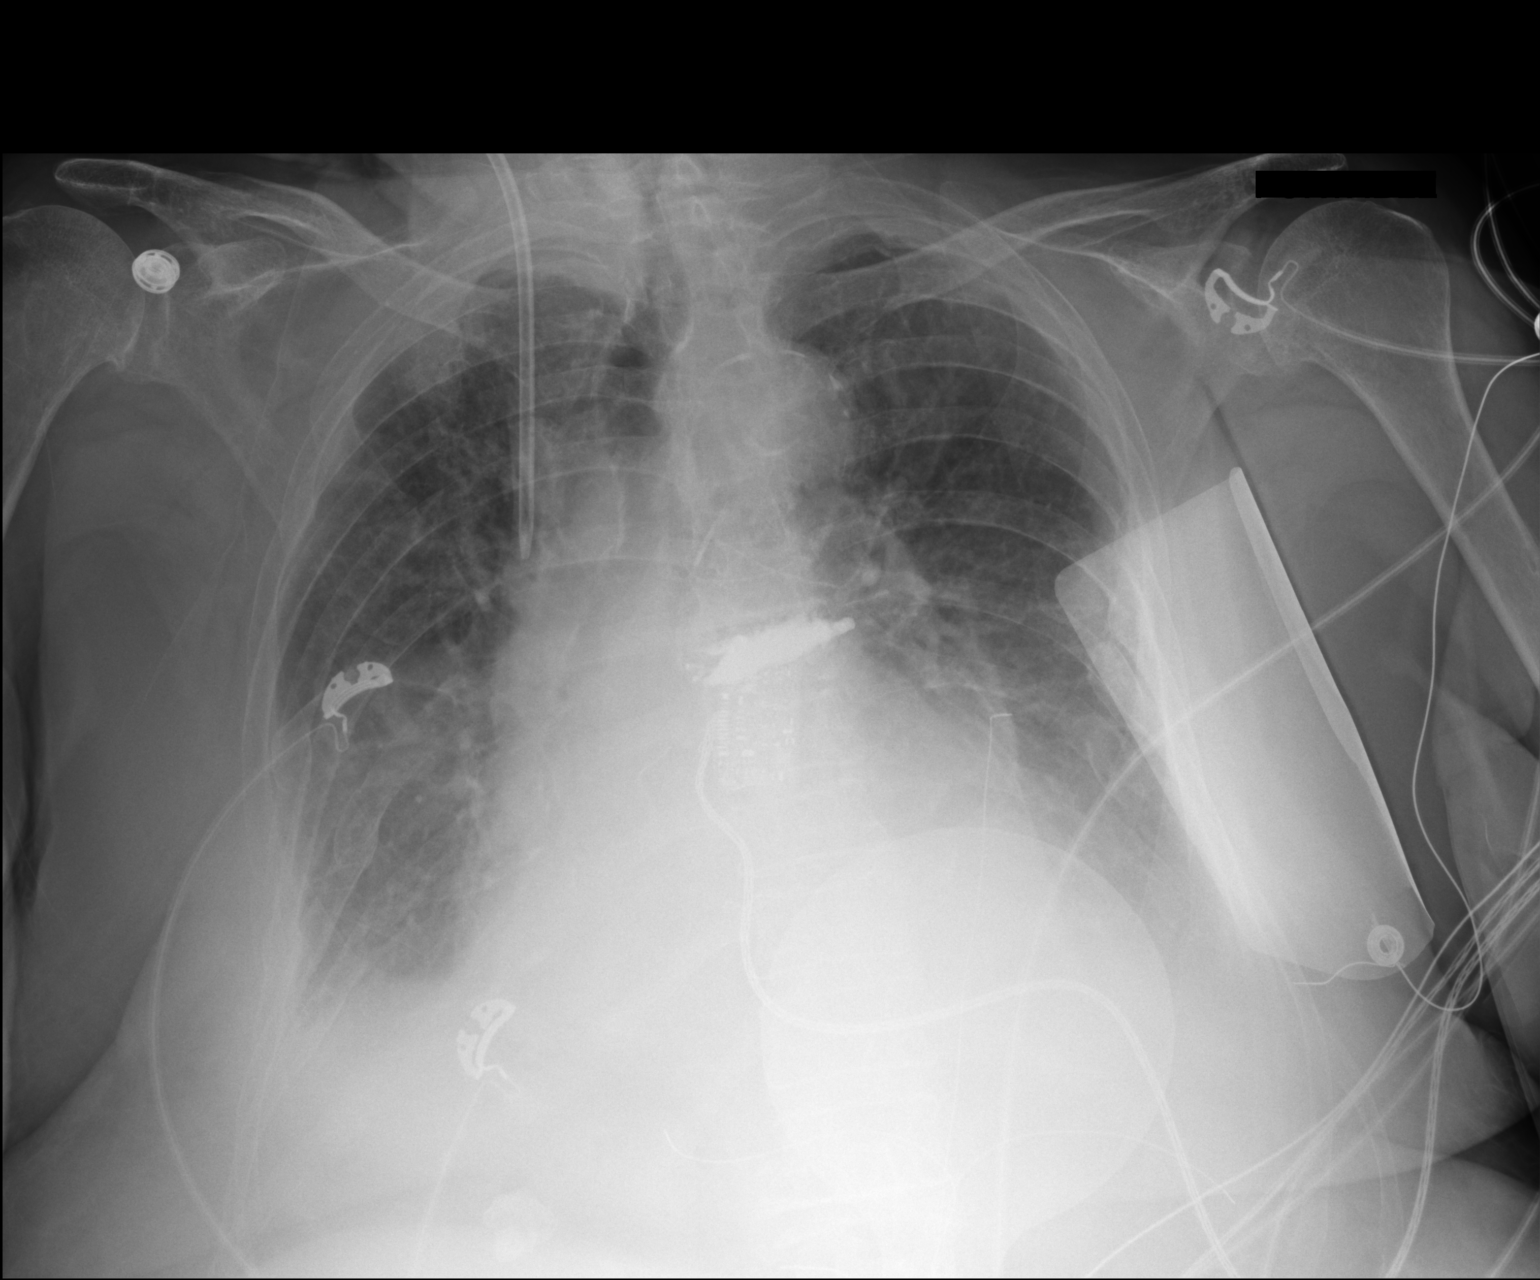

[1 of 1 positions shown; findings below may reference images not displayed]

FINDINGS: There is vascular congestion, small bilateral pleural effusions and
cardiomegaly. Vascular congestion has improved from the prior exam.
There is no convincing residual pulmonary edema.

Right internal jugular central venous catheter is stable with its
tip in the mid superior vena cava.

No pneumothorax on this semi-erect study.
IMPRESSION: 1. Improved lung aeration since prior study. No convincing residual
pulmonary edema. There is still some vascular congestion with small
bilateral effusions and cardiomegaly.
